# Patient Record
Sex: Male | Born: 1985 | Race: White | Hispanic: No | Marital: Married | Smoking: Never smoker
Health system: Southern US, Community
[De-identification: ages and names within clinical notes are randomized; demographics above are authoritative.]

## PROBLEM LIST (undated history)

## (undated) DIAGNOSIS — Q984 Klinefelter syndrome, unspecified: Secondary | ICD-10-CM

## (undated) DIAGNOSIS — I739 Peripheral vascular disease, unspecified: Secondary | ICD-10-CM

## (undated) DIAGNOSIS — J45909 Unspecified asthma, uncomplicated: Secondary | ICD-10-CM

## (undated) DIAGNOSIS — E291 Testicular hypofunction: Secondary | ICD-10-CM

## (undated) DIAGNOSIS — G4730 Sleep apnea, unspecified: Secondary | ICD-10-CM

## (undated) HISTORY — DX: Klinefelter syndrome, unspecified: Q98.4

## (undated) HISTORY — DX: Peripheral vascular disease, unspecified: I73.9

## (undated) HISTORY — DX: Sleep apnea, unspecified: G47.30

## (undated) HISTORY — DX: Unspecified asthma, uncomplicated: J45.909

## (undated) HISTORY — PX: NO PAST SURGERIES: SHX2092

## (undated) HISTORY — DX: Testicular hypofunction: E29.1

---

## 2012-06-11 DIAGNOSIS — E291 Testicular hypofunction: Secondary | ICD-10-CM | POA: Insufficient documentation

## 2012-06-11 DIAGNOSIS — Q984 Klinefelter syndrome, unspecified: Secondary | ICD-10-CM | POA: Insufficient documentation

## 2014-01-07 DIAGNOSIS — L089 Local infection of the skin and subcutaneous tissue, unspecified: Secondary | ICD-10-CM | POA: Insufficient documentation

## 2014-05-18 DIAGNOSIS — Z79899 Other long term (current) drug therapy: Secondary | ICD-10-CM | POA: Insufficient documentation

## 2014-07-06 ENCOUNTER — Encounter: Payer: Self-pay | Admitting: Surgery

## 2014-07-20 ENCOUNTER — Encounter: Payer: Self-pay | Admitting: Surgery

## 2014-08-03 ENCOUNTER — Encounter: Payer: Self-pay | Admitting: Surgery

## 2016-04-25 ENCOUNTER — Ambulatory Visit (INDEPENDENT_AMBULATORY_CARE_PROVIDER_SITE_OTHER): Payer: Managed Care, Other (non HMO) | Admitting: Family Medicine

## 2016-04-25 ENCOUNTER — Encounter: Payer: Self-pay | Admitting: Family Medicine

## 2016-04-25 VITALS — BP 137/86 | HR 66 | Temp 98.2°F | Ht >= 80 in | Wt 346.0 lb

## 2016-04-25 DIAGNOSIS — H6123 Impacted cerumen, bilateral: Secondary | ICD-10-CM

## 2016-04-25 NOTE — Patient Instructions (Signed)
Follow up as needed

## 2016-04-25 NOTE — Progress Notes (Signed)
   BP 137/86 (BP Location: Left Arm, Patient Position: Sitting, Cuff Size: Large)   Pulse 66   Temp 98.2 F (36.8 C)   Ht 6\' 8"  (2.032 m)   Wt (!) 346 lb (156.9 kg)   SpO2 97%   BMI 38.01 kg/m    Subjective:    Patient ID: Collin Butler, male    DOB: 07/15/1985, 30 y.o.   MRN: [id number]  HPI: Collin Butler is a 30 y.o. male  Chief Complaint  Patient presents with  . Cerumen Impaction   Left ear x 5 days, has been using the peroxide drops which helped initially but now it feels stopped up again over the last 3 days. No pain, but very muffled hearing and feels impacted.  Relevant past medical, surgical, family and social history reviewed and updated as indicated. Interim medical history since our last visit reviewed. Allergies and medications reviewed and updated.  Review of Systems  Constitutional: Negative.   HENT: Positive for hearing loss.   Eyes: Negative.   Respiratory: Negative.   Cardiovascular: Negative.   Gastrointestinal: Negative.   Genitourinary: Negative.   Musculoskeletal: Negative.   Neurological: Negative.   Psychiatric/Behavioral: Negative.     Per HPI unless specifically indicated above     Objective:    BP 137/86 (BP Location: Left Arm, Patient Position: Sitting, Cuff Size: Large)   Pulse 66   Temp 98.2 F (36.8 C)   Ht 6\' 8"  (2.032 m)   Wt (!) 346 lb (156.9 kg)   SpO2 97%   BMI 38.01 kg/m   Wt Readings from Last 3 Encounters:  04/25/16 (!) 346 lb (156.9 kg)    Physical Exam  Constitutional: He is oriented to person, place, and time. He appears well-developed and well-nourished.  HENT:  Head: Atraumatic.  Mouth/Throat: No oropharyngeal exudate.  Significant cerumen impaction in both EACs Gross hearing test reveals decreased hearing in left ear  Eyes: Conjunctivae are normal. No scleral icterus.  Neck: Normal range of motion. Neck supple.  Cardiovascular: Normal rate.   Pulmonary/Chest: Effort normal. No respiratory distress.    Musculoskeletal: Normal range of motion.  Neurological: He is alert and oriented to person, place, and time.  Skin: Skin is warm.  Psychiatric: He has a normal mood and affect. His behavior is normal.  Nursing note and vitals reviewed.  Procedure: B/l ear lavage with warm water mixed with hydrogen peroxide. Patient tolerated procedure well, no immediate complications noted. Wax was removed, b/l TMs intact. Hearing restored.   No results found for this or any previous visit.    Assessment & Plan:   Problem List Items Addressed This Visit    None    Visit Diagnoses    Bilateral impacted cerumen    -  Primary   Good improvement with lavage and curretage. Hearing restored. Minimal residual wax laying against left TM, recommended mineral oil drops several times a day       Follow up plan: Return if symptoms worsen or fail to improve.

## 2016-05-23 DIAGNOSIS — N400 Enlarged prostate without lower urinary tract symptoms: Secondary | ICD-10-CM | POA: Insufficient documentation

## 2016-11-28 ENCOUNTER — Ambulatory Visit (INDEPENDENT_AMBULATORY_CARE_PROVIDER_SITE_OTHER): Payer: Managed Care, Other (non HMO) | Admitting: Family Medicine

## 2016-11-28 VITALS — BP 125/83 | HR 68 | Temp 98.5°F | Ht >= 80 in | Wt 320.0 lb

## 2016-11-28 DIAGNOSIS — L237 Allergic contact dermatitis due to plants, except food: Secondary | ICD-10-CM

## 2016-11-28 MED ORDER — PREDNISONE 10 MG PO TABS: ORAL_TABLET | ORAL | 0 refills | Status: DC

## 2016-11-28 MED ORDER — CLOBETASOL PROPIONATE 0.05 % EX OINT: 1.0000 "application " | TOPICAL_OINTMENT | Freq: Two times a day (BID) | CUTANEOUS | 1 refills | Status: DC

## 2016-11-28 NOTE — Progress Notes (Signed)
   BP 125/83   Pulse 68   Temp 98.5 F (36.9 C)   Ht 6\' 8"  (2.032 m)   Wt (!) 320 lb (145.2 kg)   SpO2 98%   BMI 35.15 kg/m    Subjective:    Patient ID: Collin Butler, male    DOB: 07/28/1985, [age] y.o.   MRN: [id number]  HPI: Collin LaniusJoshua Conely is a 31 y.o. male  Chief Complaint  Patient presents with  . Rash    x 5 days, mostly on arms and legs. Itches and tingles. Did some yard work and took down vines/trees. No change in soaps or detergant. Has used Benadryl topical cream, hydrocortisone cream, oral Benadryl have not helped much.   Patient with 5 days of diffuse rash on all 4 extremities following doing some yard work. Rash is severely pruritic and is not responding to hydrocortisone or benadryl. No new soaps, detergents, lotions, foods. No other members of household have a rash and he was the only one pulling vines and bushes last week.   Relevant past medical, surgical, family and social history reviewed and updated as indicated. Interim medical history since our last visit reviewed. Allergies and medications reviewed and updated.  Review of Systems  Constitutional: Negative.   HENT: Negative.   Respiratory: Negative.   Cardiovascular: Negative.   Gastrointestinal: Negative.   Musculoskeletal: Negative.   Skin: Positive for rash.  Neurological: Negative.   Psychiatric/Behavioral: Negative.     Per HPI unless specifically indicated above     Objective:    BP 125/83   Pulse 68   Temp 98.5 F (36.9 C)   Ht 6\' 8"  (2.032 m)   Wt (!) 320 lb (145.2 kg)   SpO2 98%   BMI 35.15 kg/m   Wt Readings from Last 3 Encounters:  11/28/16 (!) 320 lb (145.2 kg)  04/25/16 (!) 346 lb (156.9 kg)    Physical Exam  Constitutional: He is oriented to person, place, and time. He appears well-developed and well-nourished. No distress.  HENT:  Head: Atraumatic.  Eyes: Conjunctivae are normal. Pupils are equal, round, and reactive to light. No scleral icterus.  Neck: Normal range of  motion. Neck supple.  Cardiovascular: Normal rate and normal heart sounds.   Pulmonary/Chest: Effort normal and breath sounds normal. No respiratory distress.  Musculoskeletal: Normal range of motion.  Neurological: He is alert and oriented to person, place, and time.  Skin: Skin is warm and dry. Rash (Diffuse erythematous rash on all 4 extremities with tiny blistering areas) noted.  Psychiatric: He has a normal mood and affect. His behavior is normal.  Nursing note and vitals reviewed.  No results found for this or any previous visit.    Assessment & Plan:   Problem List Items Addressed This Visit    None    Visit Diagnoses    Poison ivy dermatitis    -  Primary   Given significant breakout, will do long prednisone taper and clobetasol cream. Dicsussed no hot water or scented soaps or lotions, avoid scratching       Follow up plan: Return if symptoms worsen or fail to improve.

## 2017-04-26 ENCOUNTER — Encounter: Payer: Self-pay | Admitting: Family Medicine

## 2017-04-26 ENCOUNTER — Ambulatory Visit (INDEPENDENT_AMBULATORY_CARE_PROVIDER_SITE_OTHER): Payer: Managed Care, Other (non HMO) | Admitting: Family Medicine

## 2017-04-26 VITALS — BP 125/84 | HR 71 | Temp 98.4°F | Wt 333.1 lb

## 2017-04-26 DIAGNOSIS — L723 Sebaceous cyst: Secondary | ICD-10-CM

## 2017-04-26 NOTE — Progress Notes (Signed)
BP 125/84 (BP Location: Left Arm, Patient Position: Sitting, Cuff Size: Large)   Pulse 71   Temp 98.4 F (36.9 C)   Wt (!) 333 lb 2 oz (151.1 kg)   SpO2 96%   BMI 36.60 kg/m    Subjective:    Patient ID: Collin Butler, male    DOB: 04/05/1986, 31 y.o.   MRN: [id number]  HPI: Collin LaniusJoshua Paolella is a 31 y.o. male  Chief Complaint  Patient presents with  . skin lesion    on top of head x 1-2 months   SKIN LESION- thought it was a bug bite then seemed to change into a squishy hurty thing, and then it would start hurting when he would lay back on it, then went away, now not hurting, but still there Duration: 2 months Location: scalp on the back of his head Painful: no Itching: no Onset: sudden Context: not changing Associated signs and symptoms: nothing History of skin cancer: no History of precancerous skin lesions: no Family history of skin cancer: no  Relevant past medical, surgical, family and social history reviewed and updated as indicated. Interim medical history since our last visit reviewed. Allergies and medications reviewed and updated.  Review of Systems  Constitutional: Negative.   Respiratory: Negative.   Cardiovascular: Negative.   Skin: Negative.  Negative for color change, pallor, rash and wound.  Psychiatric/Behavioral: Negative.     Per HPI unless specifically indicated above     Objective:    BP 125/84 (BP Location: Left Arm, Patient Position: Sitting, Cuff Size: Large)   Pulse 71   Temp 98.4 F (36.9 C)   Wt (!) 333 lb 2 oz (151.1 kg)   SpO2 96%   BMI 36.60 kg/m   Wt Readings from Last 3 Encounters:  04/26/17 (!) 333 lb 2 oz (151.1 kg)  11/28/16 (!) 320 lb (145.2 kg)  04/25/16 (!) 346 lb (156.9 kg)    Physical Exam  Constitutional: He is oriented to person, place, and time. He appears well-developed and well-nourished. No distress.  HENT:  Head: Normocephalic and atraumatic.  Right Ear: Hearing normal.  Left Ear: Hearing normal.  Nose:  Nose normal.  Eyes: Conjunctivae and lids are normal. Right eye exhibits no discharge. Left eye exhibits no discharge. No scleral icterus.  Cardiovascular: Normal rate, regular rhythm, normal heart sounds and intact distal pulses.  Exam reveals no gallop and no friction rub.   No murmur heard. Pulmonary/Chest: Effort normal and breath sounds normal. No respiratory distress. He has no wheezes. He has no rales. He exhibits no tenderness.  Musculoskeletal: Normal range of motion.  Neurological: He is alert and oriented to person, place, and time.  Skin: Skin is warm, dry and intact. No rash noted. He is not diaphoretic. No erythema. No pallor.  1/2 in soft, not infected sebaceous cyst on the R posterior of the crown of his head  Psychiatric: He has a normal mood and affect. His speech is normal and behavior is normal. Judgment and thought content normal. Cognition and memory are normal.  Nursing note and vitals reviewed.   No results found for this or any previous visit.    Assessment & Plan:   Problem List Items Addressed This Visit    None    Visit Diagnoses    Sebaceous cyst    -  Primary   Reassured patient. Will remove if he'd like. Information provided. Call with any concerns.        Follow up plan:  Return if symptoms worsen or fail to improve.

## 2017-04-26 NOTE — Patient Instructions (Addendum)
Epidermal Cyst An epidermal cyst is sometimes called an epidermal inclusion cyst or an infundibular cyst. It is a sac made of skin tissue. The sac contains a substance called keratin. Keratin is a protein that is normally secreted through the hair follicles. When keratin becomes trapped in the top layer of skin (epidermis), it can form an epidermal cyst. Epidermal cysts are usually found on the face, neck, trunk, and genitals. These cysts are usually harmless (benign), and they may not cause symptoms unless they become infected. It is important not to pop epidermal cysts yourself. What are the causes? This condition may be caused by:  A blocked hair follicle.  A hair that curls and re-enters the skin instead of growing straight out of the skin (ingrown hair).  A blocked pore.  Irritated skin.  An injury to the skin.  Certain conditions that are passed along from parent to child (inherited).  Human papillomavirus (HPV).  What increases the risk? The following factors may make you more likely to develop an epidermal cyst:  Having acne.  Being overweight.  Wearing tight clothing.  What are the signs or symptoms? The only symptom of this condition may be a small, painless lump underneath the skin. When an epidermal cyst becomes infected, symptoms may include:  Redness.  Inflammation.  Tenderness.  Warmth.  Fever.  Keratin draining from the cyst. Keratin may look like a grayish-white, bad-smelling substance.  Pus draining from the cyst.  How is this diagnosed? This condition is diagnosed with a physical exam. In some cases, you may have a sample of tissue (biopsy) taken from your cyst to be examined under a microscope or tested for bacteria. You may be referred to a health care provider who specializes in skin care (dermatologist). How is this treated? In many cases, epidermal cysts go away on their own without treatment. If a cyst becomes infected, treatment may  include:  Opening and draining the cyst. After draining, minor surgery to remove the rest of the cyst may be done.  Antibiotic medicine to help prevent infection.  Injections of medicines (steroids) that help to reduce inflammation.  Surgery to remove the cyst. Surgery may be done if: ? The cyst becomes large. ? The cyst bothers you. ? There is a chance that the cyst could turn into cancer.  Follow these instructions at home:  Take over-the-counter and prescription medicines only as told by your health care provider.  If you were prescribed an antibiotic, use it as told by your health care provider. Do not stop using the antibiotic even if you start to feel better.  Keep the area around your cyst clean and dry.  Wear loose, dry clothing.  Do not try to pop your cyst.  Avoid touching your cyst.  Check your cyst every day for signs of infection.  Keep all follow-up visits as told by your health care provider. This is important. How is this prevented?  Wear clean, dry, clothing.  Avoid wearing tight clothing.  Keep your skin clean and dry. Shower or take baths every day.  Wash your body with a benzoyl peroxide wash when you shower or bathe. Contact a health care provider if:  Your cyst develops symptoms of infection.  Your condition is not improving or is getting worse.  You develop a cyst that looks different from other cysts you have had.  You have a fever. Get help right away if:  Redness spreads from the cyst into the surrounding area. This information is   not intended to replace advice given to you by your health care provider. Make sure you discuss any questions you have with your health care provider. Document Released: 05/20/2004 Document Revised: 02/16/2016 Document Reviewed: 04/21/2015 Elsevier Interactive Patient Education  2018 Elsevier Inc.   Epidermal Cyst Removal Epidermal cyst removal is a procedure to remove a sac of oily material that forms  under your skin (epidermal cyst). Epidermal cysts may also be called epidermoid cysts or keratin cysts. Normally, the skin secretes this oily material through a gland or a hair follicle. This type of cyst usually results when a skin gland or hair follicle becomes blocked. You may need this procedure if you have an epidermal cyst that becomes large, uncomfortable, or infected. Tell a health care provider about:  Any allergies you have.  All medicines you are taking, including vitamins, herbs, eye drops, creams, and over-the-counter medicines.  Any problems you or family members have had with anesthetic medicines.  Any blood disorders you have.  Any surgeries you have had.  Any medical conditions you have. What are the risks? Generally, this is a safe procedure. However, problems may occur, including:  Developing another cyst.  Bleeding.  Infection.  Scarring.  What happens before the procedure?  Ask your health care provider about: ? Changing or stopping your regular medicines. This is especially important if you are taking diabetes medicines or blood thinners. ? Taking medicines such as aspirin and ibuprofen. These medicines can thin your blood. Do not take these medicines before your procedure if your health care provider instructs you not to.  If you have an infected cyst, you may have to take antibiotic medicines before or after the cyst removal. Take your antibiotics as directed by your health care provider. Finish all of the medicine even if you start to feel better.  Take a shower on the morning of your procedure. Your health care provider may ask you to use a germ-killing (antiseptic) soap. What happens during the procedure?  You will be given a medicine that numbs the area (local anesthetic).  The skin around the cyst will be cleaned with a germ-killing solution (antiseptic).  Your health care provider will make a small surgical incision over the cyst.  The cyst  will be separated from the surrounding tissues that are under your skin.  If possible, the cyst will be removed undamaged (intact).  If the cyst bursts (ruptures), it will need to be removed in pieces.  After the cyst is removed, your health care provider will control any bleeding and close the incision with small stitches (sutures). Small incisions may not need sutures, and the bleeding will be controlled by applying direct pressure with gauze.  Your health care provider may apply antibiotic ointment and a light bandage (dressing) over the incision. This procedure may vary among health care providers and hospitals. What happens after the procedure?  If your cyst ruptured during surgery, you may need to take antibiotic medicine. If you were prescribed an antibiotic medicine, finish all of it even if you start to feel better. This information is not intended to replace advice given to you by your health care provider. Make sure you discuss any questions you have with your health care provider. Document Released: 06/16/2000 Document Revised: 11/25/2015 Document Reviewed: 03/04/2014 Elsevier Interactive Patient Education  2018 Elsevier Inc.  

## 2017-06-12 ENCOUNTER — Ambulatory Visit: Payer: Managed Care, Other (non HMO) | Admitting: Urology

## 2017-06-12 ENCOUNTER — Encounter: Payer: Self-pay | Admitting: Urology

## 2017-06-12 VITALS — BP 146/85 | HR 90 | Ht >= 80 in | Wt 333.1 lb

## 2017-06-12 DIAGNOSIS — E291 Testicular hypofunction: Secondary | ICD-10-CM | POA: Diagnosis not present

## 2017-06-12 DIAGNOSIS — Q984 Klinefelter syndrome, unspecified: Secondary | ICD-10-CM | POA: Diagnosis not present

## 2017-06-12 NOTE — Progress Notes (Signed)
06/12/2017 10:19 PM   Casimer LaniusJoshua Behrle 12/15/1985 [id number]  Referring provider: Particia NearingLane, Rachel Elizabeth, PA-C [address] [place], [place] [address]  Chief Complaint  Patient presents with  . Hypogonadism    HPI: 31 y.o. 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  [facility] and he has done well on Testopel at 18 pellets every 4-5 months.  His last implant was on 12/05/2016.  He complains of increased tiredness, fatigue and decreased libido.  He would like to get Testopel scheduled.His last testosterone level in early October was 203.   PMH: Past Medical History:  Diagnosis Date  . Asthma   . Hypogonadism in male   . Klinefelter syndrome   . Sleep apnea     Surgical History: History reviewed. No pertinent surgical history.  Home Medications:  Allergies as of 06/12/2017   No Known Allergies     Medication List        Accurate as of 06/12/17 10:19 PM. Always use your most recent med list.          ibuprofen 200 MG tablet Commonly known as:  ADVIL,MOTRIN Take 200 mg by mouth.   Testosterone 75 MG Pllt Inject 1,200 mg into the skin.       Allergies: No Known Allergies  Family History: Family History  Problem Relation Age of Onset  . Diabetes type I Sister   . Cancer Maternal Grandmother   . Hypertension Maternal Grandfather   . Prostate cancer Neg Hx   . Bladder Cancer Neg Hx   . Kidney cancer Neg Hx     Social History:  reports that  has never smoked. he has never used smokeless tobacco. He reports that he drinks alcohol. He reports that he does not use drugs.  ROS: UROLOGY Frequent Urination?: No Hard to postpone urination?: No Burning/pain with urination?: No Get up at night to urinate?: Yes Leakage of urine?: No Urine stream starts and stops?: No Trouble starting stream?: No Do you have to strain to urinate?: No Blood in urine?: No Urinary tract infection?: No Sexually transmitted disease?:  No Injury to kidneys or bladder?: No Painful intercourse?: No Weak stream?: No Erection problems?: No Penile pain?: No  Gastrointestinal Nausea?: No Vomiting?: No Indigestion/heartburn?: No Diarrhea?: No Constipation?: No  Constitutional Fever: No Night sweats?: No Weight loss?: No Fatigue?: Yes  Skin Skin rash/lesions?: No Itching?: No  Eyes Blurred vision?: No Double vision?: No  Ears/Nose/Throat Sore throat?: No Sinus problems?: No  Hematologic/Lymphatic Swollen glands?: No Easy bruising?: No  Cardiovascular Leg swelling?: No Chest pain?: No  Respiratory Cough?: No Shortness of breath?: No  Endocrine Excessive thirst?: No  Musculoskeletal Back pain?: No Joint pain?: No  Neurological Headaches?: No Dizziness?: No  Psychologic Depression?: No Anxiety?: No  Physical Exam: BP (!) 146/85 (BP Location: Right Arm, Patient Position: Sitting, Cuff Size: Large)   Pulse 90   Ht 6\' 8"  (2.032 m)   Wt (!) 333 lb 1.6 oz (151.1 kg)   BMI 36.59 kg/m    Constitutional:  Alert and oriented, No acute distress. HEENT: [place] AT, moist mucus membranes.  Trachea midline, no masses. Cardiovascular: No clubbing, cyanosis, or edema. Respiratory: Normal respiratory effort, no increased work of breathing. GI: Abdomen is soft, nontender, nondistended, no abdominal masses GU: No CVA tenderness.  Skin: No rashes, bruises or suspicious lesions. Lymph: No cervical or inguinal adenopathy. Neurologic: Grossly intact, no focal deficits, moving all 4 extremities. Psychiatric:  Normal mood and affect.   Assessment & Plan:   Will need to get his implant authorized by insurance and will then schedule.  Repeat testosterone level drawn today.  1. Klinefelter syndrome   2. Primary testicular hypogonadism  - Testosterone   Riki AltesScott C Opal Mckellips, MD  [facility] [address], [address] [place], [place] [address] [phone]

## 2017-06-13 ENCOUNTER — Telehealth: Payer: Self-pay

## 2017-06-13 LAB — TESTOSTERONE: Testosterone: 48 ng/dL — ABNORMAL LOW (ref 264–916)

## 2017-06-13 NOTE — Telephone Encounter (Signed)
-----   Message from Riki AltesScott C Stoioff, MD sent at 06/13/2017  7:21 AM EST ----- Testosterone level was 48.  Awaiting approval and scheduling of Testopel-18 pellets.

## 2017-06-13 NOTE — Telephone Encounter (Signed)
Did you handle this or do I need to call?

## 2017-06-18 NOTE — Telephone Encounter (Signed)
Still waiting to hear back from insurance company.

## 2017-06-18 NOTE — Telephone Encounter (Signed)
Pt has been made aware 10 testopel pellets were approved but not the 18. Pt is aware [facility] is working on getting the other 8 approved. Pt inquired about an injection. Please advise.

## 2017-06-19 ENCOUNTER — Ambulatory Visit (INDEPENDENT_AMBULATORY_CARE_PROVIDER_SITE_OTHER): Payer: Managed Care, Other (non HMO)

## 2017-06-19 DIAGNOSIS — E291 Testicular hypofunction: Secondary | ICD-10-CM

## 2017-06-19 MED ORDER — TESTOSTERONE CYPIONATE 200 MG/ML IM SOLN: 300.0000 mg | Freq: Once | INTRAMUSCULAR | 0 refills | Status: DC

## 2017-06-19 MED ORDER — TESTOSTERONE CYPIONATE 200 MG/ML IM SOLN
200.0000 mg | Freq: Once | INTRAMUSCULAR | Status: AC
  Administered 2017-06-19: 200 mg via INTRAMUSCULAR

## 2017-06-19 NOTE — Telephone Encounter (Signed)
Spoke with pt in reference to testosterone injections. Made aware can come in for a 1 time injection. Pt voiced understanding.

## 2017-06-19 NOTE — Telephone Encounter (Signed)
Rx testosterone sent to pharm- he can come in for 1 time injection

## 2017-06-19 NOTE — Progress Notes (Signed)
Testosterone IM Injection  Due to Hypogonadism patient is present today for a Testosterone Injection.  Medication: Testosterone Cypionate Dose: 1.985mL Location: right upper outer buttocks Lot: [id number] Exp:10/2018  Patient tolerated well, no complications were noted  Preformed by: Rupert Stackshelsea Mikiah Demond, LPN

## 2017-07-06 ENCOUNTER — Ambulatory Visit
Admission: EM | Admit: 2017-07-06 | Discharge: 2017-07-06 | Disposition: A | Discharge status: Home / Self Care | Payer: Managed Care, Other (non HMO) | Attending: Family Medicine | Admitting: Family Medicine

## 2017-07-06 ENCOUNTER — Other Ambulatory Visit: Payer: Self-pay

## 2017-07-06 DIAGNOSIS — M791 Myalgia, unspecified site: Secondary | ICD-10-CM

## 2017-07-06 DIAGNOSIS — J029 Acute pharyngitis, unspecified: Secondary | ICD-10-CM

## 2017-07-06 DIAGNOSIS — R509 Fever, unspecified: Secondary | ICD-10-CM

## 2017-07-06 DIAGNOSIS — J111 Influenza due to unidentified influenza virus with other respiratory manifestations: Secondary | ICD-10-CM

## 2017-07-06 DIAGNOSIS — R05 Cough: Secondary | ICD-10-CM

## 2017-07-06 DIAGNOSIS — R69 Illness, unspecified: Secondary | ICD-10-CM

## 2017-07-06 MED ORDER — OSELTAMIVIR PHOSPHATE 75 MG PO CAPS: 75.0000 mg | ORAL_CAPSULE | Freq: Two times a day (BID) | ORAL | 0 refills | Status: DC

## 2017-07-06 NOTE — ED Provider Notes (Signed)
[facility]    CSN: [id number] Arrival date & time: 07/06/17  1053     History   Chief Complaint Chief Complaint  Patient presents with  . Influenza    HPI Collin Butler is a 32 y.o. male.   The history is provided by the patient.  Influenza  Presenting symptoms: cough, fatigue, fever, myalgias, rhinorrhea and sore throat   Associated symptoms: nasal congestion   URI  Presenting symptoms: congestion, cough, fatigue, fever, rhinorrhea and sore throat   Severity:  Moderate Onset quality:  Sudden Duration:  2 days Timing:  Constant Progression:  Worsening Chronicity:  New Relieved by:  Nothing Ineffective treatments:  OTC medications Associated symptoms: arthralgias and myalgias   Risk factors: chronic respiratory disease (asthma (mild, intermittent)) and sick contacts   Risk factors: not elderly, no chronic cardiac disease, no chronic kidney disease, no diabetes mellitus, no immunosuppression, no recent illness and no recent travel     Past Medical History:  Diagnosis Date  . Asthma   . Hypogonadism in male   . Klinefelter syndrome   . Sleep apnea     There are no active problems to display for this patient.   History reviewed. No pertinent surgical history.     Home Medications    Prior to Admission medications   Medication Sig Start Date End Date Taking? Authorizing Provider  ibuprofen (ADVIL,MOTRIN) 200 MG tablet Take 200 mg by mouth.    [provider]  oseltamivir (TAMIFLU) 75 MG capsule Take 1 capsule (75 mg total) by mouth 2 (two) times daily. 07/06/17   Payton Mccallum, MD  Testosterone 75 MG PLLT Inject 1,200 mg into the skin. 06/09/14   [provider]  testosterone cypionate (DEPOTESTOSTERONE CYPIONATE) 200 MG/ML injection Inject 1.5 mLs (300 mg total) into the muscle once for 1 dose. 06/19/17 06/19/17  Riki Altes, MD    Family History Family History  Problem Relation Age of Onset  . Diabetes type I Sister     . Cancer Maternal Grandmother   . Hypertension Maternal Grandfather   . Prostate cancer Neg Hx   . Bladder Cancer Neg Hx   . Kidney cancer Neg Hx     Social History Social History   Tobacco Use  . Smoking status: Never Smoker  . Smokeless tobacco: Never Used  Substance Use Topics  . Alcohol use: Yes    Comment: rare  . Drug use: No     Allergies   Patient has no known allergies.   Review of Systems Review of Systems  Constitutional: Positive for fatigue and fever.  HENT: Positive for congestion, rhinorrhea and sore throat.   Respiratory: Positive for cough.   Musculoskeletal: Positive for arthralgias and myalgias.     Physical Exam Triage Vital Signs ED Triage Vitals  Enc Vitals Group     BP 07/06/17 1109 (!) 127/96     Pulse Rate 07/06/17 1109 (!) 123     Resp 07/06/17 1109 20     Temp 07/06/17 1109 99.8 F (37.7 C)     Temp Source 07/06/17 1109 Oral     SpO2 07/06/17 1109 99 %     Weight 07/06/17 1110 (!) 325 lb (147.4 kg)     Height 07/06/17 1110 6\' 8"  (2.032 m)     Head Circumference --      Peak Flow --      Pain Score 07/06/17 1110 4     Pain Loc --  Pain Edu? --      Excl. in GC? --    No data found.  Updated Vital Signs BP (!) 127/96   Pulse (!) 123   Temp 99.8 F (37.7 C) (Oral)   Resp 20   Ht 6\' 8"  (2.032 m)   Wt (!) 325 lb (147.4 kg)   SpO2 99%   BMI 35.70 kg/m   Visual Acuity Right Eye Distance:   Left Eye Distance:   Bilateral Distance:    Right Eye Near:   Left Eye Near:    Bilateral Near:     Physical Exam  Constitutional: He appears well-developed and well-nourished. No distress.  HENT:  Head: Normocephalic and atraumatic.  Right Ear: Tympanic membrane, external ear and ear canal normal.  Left Ear: Tympanic membrane, external ear and ear canal normal.  Nose: Nose normal.  Mouth/Throat: Uvula is midline and mucous membranes are normal. Posterior oropharyngeal erythema present. No oropharyngeal exudate, posterior  oropharyngeal edema or tonsillar abscesses. No tonsillar exudate.  Eyes: Conjunctivae and EOM are normal. Pupils are equal, round, and reactive to light. Right eye exhibits no discharge. Left eye exhibits no discharge. No scleral icterus.  Neck: Normal range of motion. Neck supple. No tracheal deviation present. No thyromegaly present.  Cardiovascular: Normal rate, regular rhythm and normal heart sounds.  Pulmonary/Chest: Effort normal and breath sounds normal. No stridor. No respiratory distress. He has no wheezes. He has no rales. He exhibits no tenderness.  Lymphadenopathy:    He has no cervical adenopathy.  Neurological: He is alert.  Skin: Skin is warm and dry. No rash noted. He is not diaphoretic.  Nursing note and vitals reviewed.    UC Treatments / Results  Labs (all labs ordered are listed, but only abnormal results are displayed) Labs Reviewed - No data to display  EKG  EKG Interpretation None       Radiology No results found.  Procedures Procedures (including critical care time)  Medications Ordered in UC Medications - No data to display   Initial Impression / Assessment and Plan / UC Course  I have reviewed the triage vital signs and the nursing notes.  Pertinent labs & imaging results that were available during my care of the patient were reviewed by me and considered in my medical decision making (see chart for details).       Final Clinical Impressions(s) / UC Diagnoses   Final diagnoses:  Influenza-like illness    ED Discharge Orders        Ordered    oseltamivir (TAMIFLU) 75 MG capsule  2 times daily     07/06/17 1204     1. diagnosis reviewed with patient 2. rx as per orders above; reviewed possible side effects, interactions, risks and benefits  3. Recommend supportive treatment with rest, fluids, otc analgesics prn 4. Follow-up prn if symptoms worsen or don't improve   Controlled Substance Prescriptions [place] Controlled Substance  Registry consulted? Not Applicable   Payton Mccallumonty, Mayco Walrond, MD 07/06/17 1245

## 2017-07-06 NOTE — ED Triage Notes (Signed)
Pt with generalized body aches, sore throat, headache, chills & sweats, cough, runny nose x past few days. Pain 4/10

## 2017-07-12 ENCOUNTER — Ambulatory Visit: Payer: Managed Care, Other (non HMO) | Admitting: Family Medicine

## 2017-07-12 ENCOUNTER — Ambulatory Visit (INDEPENDENT_AMBULATORY_CARE_PROVIDER_SITE_OTHER): Payer: Managed Care, Other (non HMO) | Admitting: Family Medicine

## 2017-07-12 ENCOUNTER — Encounter: Payer: Self-pay | Admitting: Family Medicine

## 2017-07-12 VITALS — BP 131/84 | HR 90 | Temp 98.5°F | Wt 337.0 lb

## 2017-07-12 DIAGNOSIS — R509 Fever, unspecified: Secondary | ICD-10-CM | POA: Diagnosis not present

## 2017-07-12 DIAGNOSIS — J4521 Mild intermittent asthma with (acute) exacerbation: Secondary | ICD-10-CM | POA: Diagnosis not present

## 2017-07-12 DIAGNOSIS — J45909 Unspecified asthma, uncomplicated: Secondary | ICD-10-CM | POA: Insufficient documentation

## 2017-07-12 MED ORDER — AZITHROMYCIN 250 MG PO TABS: ORAL_TABLET | ORAL | 0 refills | Status: DC

## 2017-07-12 MED ORDER — BENZONATATE 100 MG PO CAPS: 100.0000 mg | ORAL_CAPSULE | Freq: Two times a day (BID) | ORAL | 0 refills | Status: DC | PRN

## 2017-07-12 MED ORDER — PREDNISONE 10 MG PO TABS: ORAL_TABLET | ORAL | 0 refills | Status: DC

## 2017-07-12 NOTE — Progress Notes (Signed)
   BP 131/84   Pulse 90   Temp 98.5 F (36.9 C) (Oral)   Wt (!) 337 lb (152.9 kg)   SpO2 99%   BMI 37.02 kg/m    Subjective:    Patient ID: Collin Butler, male    DOB: 08/14/1985, 32 y.o.   MRN: [id number]  HPI: Collin Butler is a 32 y.o. male  atient with cough cold congestion ongoing for weeks  Has been using albuterol inhaler to help with cough as has asthma and feels like he is having an exacerbation of asthma. Also sinus pressure congestion  Relevant past medical, surgical, family and social history reviewed and updated as indicated. Interim medical history since our last visit reviewed. Allergies and medications reviewed and updated.  Review of Systems  Constitutional: Positive for chills, diaphoresis and fatigue. Negative for fever.  HENT: Positive for congestion.   Respiratory: Positive for cough, chest tightness, shortness of breath and wheezing.   Cardiovascular: Negative.     Per HPI unless specifically indicated above     Objective:    BP 131/84   Pulse 90   Temp 98.5 F (36.9 C) (Oral)   Wt (!) 337 lb (152.9 kg)   SpO2 99%   BMI 37.02 kg/m   Wt Readings from Last 3 Encounters:  07/12/17 (!) 337 lb (152.9 kg)  07/06/17 (!) 325 lb (147.4 kg)  06/12/17 (!) 333 lb 1.6 oz (151.1 kg)    Physical Exam  Constitutional: He is oriented to person, place, and time. He appears well-developed and well-nourished.  HENT:  Head: Normocephalic and atraumatic.  Right Ear: External ear normal.  Left Ear: External ear normal.  Mouth/Throat: Oropharynx is clear and moist.  Eyes: Conjunctivae and EOM are normal.  Neck: Normal range of motion.  Cardiovascular: Normal rate, regular rhythm and normal heart sounds.  Pulmonary/Chest: Effort normal. He has wheezes. He has no rales. He exhibits no tenderness.  Musculoskeletal: Normal range of motion.  Neurological: He is alert and oriented to person, place, and time.  Skin: No erythema.  Psychiatric: He has a normal mood  and affect. His behavior is normal. Judgment and thought content normal.    Results for orders placed or performed in visit on 06/12/17  Testosterone  Result Value Ref Range   Testosterone 48 (L) 264 - 916 ng/dL      Assessment & Plan:   Problem List Items Addressed This Visit      Respiratory   Asthma    Discuss asthma bronchitisand is been ongoing long enough will start with a Z-Pak?and Tessalon Perles Patient education given on use of medication      Relevant Medications   predniSONE (DELTASONE) 10 MG tablet    Other Visit Diagnoses    Fever, unspecified fever cause    -  Primary   Relevant Orders   Influenza a and b       Follow up plan: Return for As scheduled.

## 2017-07-12 NOTE — Assessment & Plan Note (Signed)
Discuss asthma bronchitisand is been ongoing long enough will start with a Z-Pak?and Tessalon Perles Patient education given on use of medication

## 2017-09-04 ENCOUNTER — Ambulatory Visit (INDEPENDENT_AMBULATORY_CARE_PROVIDER_SITE_OTHER): Payer: Managed Care, Other (non HMO) | Admitting: Urology

## 2017-09-04 ENCOUNTER — Telehealth: Payer: Self-pay | Admitting: Urology

## 2017-09-04 ENCOUNTER — Encounter: Payer: Self-pay | Admitting: Urology

## 2017-09-04 VITALS — BP 140/82 | HR 89 | Ht >= 80 in | Wt 337.6 lb

## 2017-09-04 DIAGNOSIS — E291 Testicular hypofunction: Secondary | ICD-10-CM | POA: Diagnosis not present

## 2017-09-04 NOTE — Progress Notes (Signed)
Procedure note: Testopel  Diagnosis: Hypogonadism  Physician: Riki AltesScott C Shallyn Constancio, MD  Assistant: Teressa Lowerarrie Garrison  Anesthetic: 1% plain Xylocaine with epinephrine  Description: A site on the upper outer right buttock was selected and prepped with Betadine and draped in the usual fashion.  The site was anesthetized with 5 mL of the above anesthetic in a fan shape configuration.  A stab incision was made with 11 blade and spread with forceps.  Using the implantation trocar 18 pellets were placed in 4 rows. The site was closed with 5-0 Monocryl and dressed with Steri-Strips, gauze and Tegaderm.  Body weight pressure was placed for 5 minutes.  Complications: None  EBL: Minimal  Discharge instructions: 1.  May shower in 24 hours.  No swimming, tub bath X 1 week. 2.  Call for redness, drainage, increasing pain or fever greater than 101 degrees. 3.  Follow-up testosterone level and hematocrit in 2 months.

## 2017-09-04 NOTE — Telephone Encounter (Signed)
Testopel insurance authorization  [employer] [id number] Authorization # [id number] 1350 mg (18 pellets) every 3 months through 11/01/2017

## 2017-11-05 ENCOUNTER — Other Ambulatory Visit: Payer: Managed Care, Other (non HMO)

## 2017-11-05 ENCOUNTER — Other Ambulatory Visit: Payer: Self-pay

## 2017-11-05 DIAGNOSIS — E291 Testicular hypofunction: Secondary | ICD-10-CM

## 2017-11-06 LAB — TESTOSTERONE: TESTOSTERONE: 723 ng/dL (ref 264–916)

## 2017-11-06 LAB — HEMATOCRIT: Hematocrit: 49.5 % (ref 37.5–51.0)

## 2017-11-09 ENCOUNTER — Telehealth: Payer: Self-pay

## 2017-11-09 NOTE — Telephone Encounter (Signed)
-----   Message from Riki Altes, MD sent at 11/08/2017  7:09 PM EDT ----- Testosterone level looks good at 723.  Hematocrit normal at 48.  Recommend repeat testosterone level in 2 months and repeat Testopel implant approximately 2 weeks after his testosterone blood draw.

## 2017-11-09 NOTE — Telephone Encounter (Signed)
Testosterone lab scheduled, pt informed, please call and schedule testopel for aprox 2 wks after 01/08/18. Thank you

## 2017-11-13 ENCOUNTER — Telehealth: Payer: Self-pay | Admitting: Urology

## 2017-11-13 NOTE — Telephone Encounter (Signed)
App made ° ° °Michelle  °

## 2018-01-08 ENCOUNTER — Other Ambulatory Visit: Payer: Managed Care, Other (non HMO)

## 2018-01-08 DIAGNOSIS — E291 Testicular hypofunction: Secondary | ICD-10-CM

## 2018-01-09 ENCOUNTER — Telehealth: Payer: Self-pay | Admitting: Family Medicine

## 2018-01-09 LAB — TESTOSTERONE: TESTOSTERONE: 334 ng/dL (ref 264–916)

## 2018-01-09 NOTE — Telephone Encounter (Signed)
-----   Message from Riki AltesScott C Stoioff, MD sent at 01/09/2018  7:46 AM EDT ----- Testosterone level was 334.  Keep scheduled Testopel appointment later this month.

## 2018-01-09 NOTE — Telephone Encounter (Signed)
LMOM for patient to return call.

## 2018-01-11 NOTE — Telephone Encounter (Signed)
Called pt, LM for pt informing him of the information below. Advised to call back for questions or concerns.

## 2018-01-22 ENCOUNTER — Encounter: Payer: Self-pay | Admitting: Urology

## 2018-01-22 ENCOUNTER — Ambulatory Visit (INDEPENDENT_AMBULATORY_CARE_PROVIDER_SITE_OTHER): Payer: Managed Care, Other (non HMO) | Admitting: Urology

## 2018-01-22 VITALS — BP 133/84 | HR 75 | Ht >= 80 in | Wt 346.1 lb

## 2018-01-22 DIAGNOSIS — E291 Testicular hypofunction: Secondary | ICD-10-CM | POA: Diagnosis not present

## 2018-01-22 NOTE — Progress Notes (Signed)
Procedure note: Testopel  Diagnosis: Hypogonadism  Physician: Riki AltesScott C Zuria Fosdick, MD  Assistant: Debbe Baleskinawa Glanton, CMA  Anesthetic: 1% plain Xylocaine with epinephrine  Description: A site on the upper outer left buttock was selected and prepped with Betadine and draped in the usual fashion.  The site was anesthetized with 10 mL of the above anesthetic in a fan shape and configuration.  A stab incision was made with 11 blade and spread with forceps.  Using the implantation trocar 5 pellets were placed in 2 rows and 4 pellets in 2 rows for a total of 18 pellets.  The site was dressed with Steri-Strips, gauze and a Tegaderm.  The site was closed with 5-0 Vicryl and dressed with Steri-Strips, gauze and Tegaderm.  Body weight pressure was placed for 5 minutes.  Complications: None  EBL: Minimal  Discharge instructions: 1.  May shower in 24 hours.  No swimming, tub bath X 1 week. 2.  Call for redness, drainage, increasing pain or fever greater than 101 degrees. 3.  Follow-up lab visit 3 months for testosterone, PSA, hematocrit and 4 months for tentative repeat Testopel.

## 2018-01-23 ENCOUNTER — Encounter: Payer: Self-pay | Admitting: Urology

## 2018-03-27 ENCOUNTER — Ambulatory Visit (INDEPENDENT_AMBULATORY_CARE_PROVIDER_SITE_OTHER): Payer: Managed Care, Other (non HMO) | Admitting: Family Medicine

## 2018-03-27 ENCOUNTER — Other Ambulatory Visit: Payer: Self-pay

## 2018-03-27 ENCOUNTER — Encounter: Payer: Self-pay | Admitting: Family Medicine

## 2018-03-27 VITALS — BP 138/91 | HR 71 | Temp 98.0°F | Ht >= 80 in | Wt 353.0 lb

## 2018-03-27 DIAGNOSIS — Z7689 Persons encountering health services in other specified circumstances: Secondary | ICD-10-CM

## 2018-03-27 DIAGNOSIS — E291 Testicular hypofunction: Secondary | ICD-10-CM | POA: Diagnosis not present

## 2018-03-27 DIAGNOSIS — Z0000 Encounter for general adult medical examination without abnormal findings: Secondary | ICD-10-CM | POA: Diagnosis not present

## 2018-03-27 DIAGNOSIS — Z23 Encounter for immunization: Secondary | ICD-10-CM | POA: Diagnosis not present

## 2018-03-27 DIAGNOSIS — I872 Venous insufficiency (chronic) (peripheral): Secondary | ICD-10-CM

## 2018-03-27 DIAGNOSIS — J309 Allergic rhinitis, unspecified: Secondary | ICD-10-CM | POA: Insufficient documentation

## 2018-03-27 DIAGNOSIS — R221 Localized swelling, mass and lump, neck: Secondary | ICD-10-CM | POA: Diagnosis not present

## 2018-03-27 LAB — MICROSCOPIC EXAMINATION
BACTERIA UA: NONE SEEN
RBC, UA: NONE SEEN /hpf (ref 0–2)

## 2018-03-27 LAB — UA/M W/RFLX CULTURE, ROUTINE
Bilirubin, UA: NEGATIVE
Glucose, UA: NEGATIVE
Ketones, UA: NEGATIVE
LEUKOCYTES UA: NEGATIVE
Nitrite, UA: NEGATIVE
PH UA: 5.5 (ref 5.0–7.5)
RBC, UA: NEGATIVE
Specific Gravity, UA: 1.015 (ref 1.005–1.030)
Urobilinogen, Ur: 0.2 mg/dL (ref 0.2–1.0)

## 2018-03-27 NOTE — Patient Instructions (Signed)
Tdap Vaccine (Tetanus, Diphtheria and Pertussis): What You Need to Know 1. Why get vaccinated? Tetanus, diphtheria and pertussis are very serious diseases. Tdap vaccine can protect us from these diseases. And, Tdap vaccine given to pregnant women can protect newborn babies against pertussis. TETANUS (Lockjaw) is rare in the [place] today. It causes painful muscle tightening and stiffness, usually all over the body.  It can lead to tightening of muscles in the head and neck so you can't open your mouth, swallow, or sometimes even breathe. Tetanus kills about 1 out of 10 people who are infected even after receiving the best medical care.  DIPHTHERIA is also rare in the [place] today. It can cause a thick coating to form in the back of the throat.  It can lead to breathing problems, heart failure, paralysis, and death.  PERTUSSIS (Whooping Cough) causes severe coughing spells, which can cause difficulty breathing, vomiting and disturbed sleep.  It can also lead to weight loss, incontinence, and rib fractures. Up to 2 in 100 adolescents and 5 in 100 adults with pertussis are hospitalized or have complications, which could include pneumonia or death.  These diseases are caused by bacteria. Diphtheria and pertussis are spread from person to person through secretions from coughing or sneezing. Tetanus enters the body through cuts, scratches, or wounds. Before vaccines, as many as 200,000 cases of diphtheria, 200,000 cases of pertussis, and hundreds of cases of tetanus, were reported in the [place] each year. Since vaccination began, reports of cases for tetanus and diphtheria have dropped by about 99% and for pertussis by about 80%. 2. Tdap vaccine Tdap vaccine can protect adolescents and adults from tetanus, diphtheria, and pertussis. One dose of Tdap is routinely given at age 11 or 12. People who did not get Tdap at that age should get it as soon as possible. Tdap is especially  important for healthcare professionals and anyone having close contact with a baby younger than 12 months. Pregnant women should get a dose of Tdap during every pregnancy, to protect the newborn from pertussis. Infants are most at risk for severe, life-threatening complications from pertussis. Another vaccine, called Td, protects against tetanus and diphtheria, but not pertussis. A Td booster should be given every 10 years. Tdap may be given as one of these boosters if you have never gotten Tdap before. Tdap may also be given after a severe cut or burn to prevent tetanus infection. Your doctor or the person giving you the vaccine can give you more information. Tdap may safely be given at the same time as other vaccines. 3. Some people should not get this vaccine  A person who has ever had a life-threatening allergic reaction after a previous dose of any diphtheria, tetanus or pertussis containing vaccine, OR has a severe allergy to any part of this vaccine, should not get Tdap vaccine. Tell the person giving the vaccine about any severe allergies.  Anyone who had coma or long repeated seizures within 7 days after a childhood dose of DTP or DTaP, or a previous dose of Tdap, should not get Tdap, unless a cause other than the vaccine was found. They can still get Td.  Talk to your doctor if you: ? have seizures or another nervous system problem, ? had severe pain or swelling after any vaccine containing diphtheria, tetanus or pertussis, ? ever had a condition called Guillain-Barr Syndrome (GBS), ? aren't feeling well on the day the shot is scheduled. 4. Risks With any medicine, including   vaccines, there is a chance of side effects. These are usually mild and go away on their own. Serious reactions are also possible but are rare. Most people who get Tdap vaccine do not have any problems with it. Mild problems following Tdap: (Did not interfere with activities)  Pain where the shot was given (about  3 in 4 adolescents or 2 in 3 adults)  Redness or swelling where the shot was given (about 1 person in 5)  Mild fever of at least 100.4F (up to about 1 in 25 adolescents or 1 in 100 adults)  Headache (about 3 or 4 people in 10)  Tiredness (about 1 person in 3 or 4)  Nausea, vomiting, diarrhea, stomach ache (up to 1 in 4 adolescents or 1 in 10 adults)  Chills, sore joints (about 1 person in 10)  Body aches (about 1 person in 3 or 4)  Rash, swollen glands (uncommon)  Moderate problems following Tdap: (Interfered with activities, but did not require medical attention)  Pain where the shot was given (up to 1 in 5 or 6)  Redness or swelling where the shot was given (up to about 1 in 16 adolescents or 1 in 12 adults)  Fever over 102F (about 1 in 100 adolescents or 1 in 250 adults)  Headache (about 1 in 7 adolescents or 1 in 10 adults)  Nausea, vomiting, diarrhea, stomach ache (up to 1 or 3 people in 100)  Swelling of the entire arm where the shot was given (up to about 1 in 500).  Severe problems following Tdap: (Unable to perform usual activities; required medical attention)  Swelling, severe pain, bleeding and redness in the arm where the shot was given (rare).  Problems that could happen after any vaccine:  People sometimes faint after a medical procedure, including vaccination. Sitting or lying down for about 15 minutes can help prevent fainting, and injuries caused by a fall. Tell your doctor if you feel dizzy, or have vision changes or ringing in the ears.  Some people get severe pain in the shoulder and have difficulty moving the arm where a shot was given. This happens very rarely.  Any medication can cause a severe allergic reaction. Such reactions from a vaccine are very rare, estimated at fewer than 1 in a million doses, and would happen within a few minutes to a few hours after the vaccination. As with any medicine, there is a very remote chance of a vaccine  causing a serious injury or death. The safety of vaccines is always being monitored. For more information, visit: www.cdc.gov/vaccinesafety/ 5. What if there is a serious problem? What should I look for? Look for anything that concerns you, such as signs of a severe allergic reaction, very high fever, or unusual behavior. Signs of a severe allergic reaction can include hives, swelling of the face and throat, difficulty breathing, a fast heartbeat, dizziness, and weakness. These would usually start a few minutes to a few hours after the vaccination. What should I do?  If you think it is a severe allergic reaction or other emergency that can't wait, call 9-1-1 or get the person to the nearest hospital. Otherwise, call your doctor.  Afterward, the reaction should be reported to the Vaccine Adverse Event Reporting System (VAERS). Your doctor might file this report, or you can do it yourself through the VAERS web site at www.vaers.hhs.gov, or by calling 1-800-822-7967. ? VAERS does not give medical advice. 6. The National Vaccine Injury Compensation Program The National   Vaccine Injury Compensation Program (VICP) is a federal program that was created to compensate people who may have been injured by certain vaccines. Persons who believe they may have been injured by a vaccine can learn about the program and about filing a claim by calling 1-800-338-2382 or visiting the VICP website at www.hrsa.gov/vaccinecompensation. There is a time limit to file a claim for compensation. 7. How can I learn more?  Ask your doctor. He or she can give you the vaccine package insert or suggest other sources of information.  Call your local or state health department.  Contact the Centers for Disease Control and Prevention (CDC): ? Call 1-800-232-4636 (1-800-CDC-INFO) or ? Visit CDC's website at www.cdc.gov/vaccines CDC Tdap Vaccine VIS (08/26/13) This information is not intended to replace advice given to you by your  health care provider. Make sure you discuss any questions you have with your health care provider. Document Released: 12/19/2011 Document Revised: 03/09/2016 Document Reviewed: 03/09/2016 Elsevier Interactive Patient Education  2017 [employer]  

## 2018-03-27 NOTE — Assessment & Plan Note (Signed)
Followed by Urology, currently on testosterone supplementation

## 2018-03-27 NOTE — Assessment & Plan Note (Signed)
Stable on OTC xyzal, continue current regimen

## 2018-03-27 NOTE — Progress Notes (Signed)
BP (!) 138/91   Pulse 71   Temp 98 F (36.7 C) (Oral)   Ht 6\' 8"  (2.032 m)   Wt (!) 353 lb (160.1 kg)   SpO2 96%   BMI 38.78 kg/m    Subjective:    Patient ID: Collin Butler, male    DOB: 1985-09-30, 32 y.o.   MRN: [id number]  HPI: Collin Butler is a 32 y.o. male presenting on 03/27/2018 for comprehensive medical examination. Current medical complaints include:see below  Daytime somnolence despite sleeping adequate hours, people around him hearing him gasping in his sleep. No previous sleep studies performed. Does not take anything for sleep.   Mass on back of neck x 2-3 years. Has not changed since first noticed and does not cause any pain. Not trying anything on it.   Xyzal OTC doing well for allergies.   Having venous issues right medial leg, vascular has offered procedure 3 years ago but declined at the time. Now wanting to proceed.   Followed by Urology for testosterone replacement for testicular hypofunction.   He currently lives with: wife Interim Problems from his last visit: no  Depression Screen done today and results listed below:  Depression screen [facility] 2/9 03/27/2018 07/12/2017 04/26/2017  Decreased Interest 0 0 0  Down, Depressed, Hopeless 0 0 0  PHQ - 2 Score 0 0 0  Altered sleeping 1 - -  Tired, decreased energy 1 - -  Change in appetite 0 - -  Feeling bad or failure about yourself  0 - -  Trouble concentrating 0 - -  Moving slowly or fidgety/restless 0 - -  Suicidal thoughts 0 - -  PHQ-9 Score 2 - -    The patient does not have a history of falls. I did not complete a risk assessment for falls. A plan of care for falls was not documented.   Past Medical History:  Past Medical History:  Diagnosis Date  . Asthma   . Hypogonadism in male   . Klinefelter syndrome   . Sleep apnea     Surgical History:  History reviewed. No pertinent surgical history.  Medications:  Current Outpatient Medications on File Prior to Visit  Medication Sig  .  ibuprofen (ADVIL,MOTRIN) 200 MG tablet Take 200 mg by mouth.  . levocetirizine (XYZAL) 5 MG tablet Take 5 mg by mouth daily.  . Testosterone 75 MG PLLT Inject 1,200 mg into the skin.   No current facility-administered medications on file prior to visit.     Allergies:  No Known Allergies  Social History:  Social History   Socioeconomic History  . Marital status: Married    Spouse name: Not on file  . Number of children: Not on file  . Years of education: Not on file  . Highest education level: Not on file  Occupational History  . Not on file  Social Needs  . Financial resource strain: Not on file  . Food insecurity:    Worry: Not on file    Inability: Not on file  . Transportation needs:    Medical: Not on file    Non-medical: Not on file  Tobacco Use  . Smoking status: Never Smoker  . Smokeless tobacco: Never Used  Substance and Sexual Activity  . Alcohol use: Yes    Comment: rare  . Drug use: No  . Sexual activity: Yes  Lifestyle  . Physical activity:    Days per week: Not on file    Minutes per session: Not  on file  . Stress: Not on file  Relationships  . Social connections:    Talks on phone: Not on file    Gets together: Not on file    Attends religious service: Not on file    Active member of club or organization: Not on file    Attends meetings of clubs or organizations: Not on file    Relationship status: Not on file  . Intimate partner violence:    Fear of current or ex partner: Not on file    Emotionally abused: Not on file    Physically abused: Not on file    Forced sexual activity: Not on file  Other Topics Concern  . Not on file  Social History Narrative  . Not on file   Social History   Tobacco Use  Smoking Status Never Smoker  Smokeless Tobacco Never Used   Social History   Substance and Sexual Activity  Alcohol Use Yes   Comment: rare    Family History:  Family History  Problem Relation Age of Onset  . Diabetes type I Sister    . Cancer Maternal Grandmother   . Hypertension Maternal Grandfather   . Prostate cancer Neg Hx   . Bladder Cancer Neg Hx   . Kidney cancer Neg Hx     Past medical history, surgical history, medications, allergies, family history and social history reviewed with patient today and changes made to appropriate areas of the chart.   Review of Systems - General ROS: positive for  - fatigue Psychological ROS: positive for - sleep disturbances Ophthalmic ROS: negative ENT ROS: negative Allergy and Immunology ROS: negative Hematological and Lymphatic ROS: negative Endocrine ROS: negative Respiratory ROS: no cough, shortness of breath, or wheezing Cardiovascular ROS: positive for - poor wound healing b/l LEs, swelling Gastrointestinal ROS: no abdominal pain, change in bowel habits, or black or bloody stools Genito-Urinary ROS: no dysuria, trouble voiding, or hematuria Musculoskeletal ROS: negative Neurological ROS: no TIA or stroke symptoms Dermatological ROS: negative All other ROS negative except what is listed above and in the HPI.      Objective:    BP (!) 138/91   Pulse 71   Temp 98 F (36.7 C) (Oral)   Ht 6\' 8"  (2.032 m)   Wt (!) 353 lb (160.1 kg)   SpO2 96%   BMI 38.78 kg/m   Wt Readings from Last 3 Encounters:  03/27/18 (!) 353 lb (160.1 kg)  01/22/18 (!) 346 lb 1.6 oz (157 kg)  09/04/17 (!) 337 lb 9.6 oz (153.1 kg)    Physical Exam  Constitutional: He is oriented to person, place, and time. He appears well-developed and well-nourished. No distress.  HENT:  Head: Atraumatic.  Right Ear: External ear normal.  Left Ear: External ear normal.  Nose: Nose normal.  Mouth/Throat: Oropharynx is clear and moist.  Eyes: Pupils are equal, round, and reactive to light. Conjunctivae are normal. No scleral icterus.  Neck: Normal range of motion. Neck supple.  Cardiovascular: Normal rate, regular rhythm, normal heart sounds and intact distal pulses.  No murmur  heard. Pulmonary/Chest: Effort normal and breath sounds normal. No respiratory distress.  Abdominal: Soft. Bowel sounds are normal. He exhibits no distension and no mass. There is no tenderness. There is no guarding.  Musculoskeletal: Normal range of motion. He exhibits no edema or tenderness.  Neurological: He is alert and oriented to person, place, and time. He has normal reflexes.  Skin: Skin is warm and dry.  Darkened,  scaly areas b/l medial lower legs above ankles from chronic venous insufficiency and poor wound healing  Smooth, mobile mass on posterior neck, nontender, non erythematous  Psychiatric: He has a normal mood and affect. His behavior is normal.  Nursing note and vitals reviewed.   Results for orders placed or performed in visit on 03/27/18  Microscopic Examination  Result Value Ref Range   WBC, UA 0-5 0 - 5 /hpf   RBC, UA None seen 0 - 2 /hpf   Epithelial Cells (non renal) CANCELED    Bacteria, UA None seen None seen/Few  UA/M w/rflx Culture, Routine  Result Value Ref Range   Specific Gravity, UA 1.015 1.005 - 1.030   pH, UA 5.5 5.0 - 7.5   Color, UA Yellow Yellow   Appearance Ur Clear Clear   Leukocytes, UA Negative Negative   Protein, UA 1+ (A) Negative/Trace   Glucose, UA Negative Negative   Ketones, UA Negative Negative   RBC, UA Negative Negative   Bilirubin, UA Negative Negative   Urobilinogen, Ur 0.2 0.2 - 1.0 mg/dL   Nitrite, UA Negative Negative   Microscopic Examination See below:       Assessment & Plan:   Problem List Items Addressed This Visit      Respiratory   Allergic rhinitis    Stable on OTC xyzal, continue current regimen        Endocrine   Testicular hypofunction    Followed by Urology, currently on testosterone supplementation       Other Visit Diagnoses    Sleep concern    -  Primary   Referral placed for sleep study   Relevant Orders   Ambulatory referral to Sleep Studies   Annual physical exam       Relevant Orders    CBC with Differential/Platelet   Comprehensive metabolic panel   Lipid Panel w/o Chol/HDL Ratio   UA/M w/rflx Culture, Routine (Completed)   Need for Tdap vaccination       Relevant Orders   Tdap vaccine greater than or equal to 7yo IM (Completed)   Flu vaccine need       Relevant Orders   Flu Vaccine QUAD 36+ mos IM (Completed)   Venous insufficiency       Referral placed back to Vascular for further management. Continue elevation of legs and use of compression stockings for now   Relevant Orders   Ambulatory referral to Vascular Surgery   Neck mass       Appears to be sebaceous cyst, non-inflamed. Will continue to monitor for now, pt to let us know he he decides on excision       Discussed aspirin prophylaxis for myocardial infarction prevention and decision was it was not indicated  LABORATORY TESTING:  Health maintenance labs ordered today as discussed above.   The natural history of prostate cancer and ongoing controversy regarding screening and potential treatment outcomes of prostate cancer has been discussed with the patient. The meaning of a false positive PSA and a false negative PSA has been discussed. He indicates understanding of the limitations of this screening test and wishes not to proceed with screening PSA testing.   IMMUNIZATIONS:   - Tdap: Tetanus vaccination status reviewed: Td vaccination indicated and given today. - Influenza: Administered today  PATIENT COUNSELING:    Sexuality: Discussed sexually transmitted diseases, partner selection, use of condoms, avoidance of unintended pregnancy  and contraceptive alternatives.   Advised to avoid cigarette smoking.  I discussed with  the patient that most people either abstain from alcohol or drink within safe limits (<=14/week and <=4 drinks/occasion for males, <=7/weeks and <= 3 drinks/occasion for females) and that the risk for alcohol disorders and other health effects rises proportionally with the number of  drinks per week and how often a drinker exceeds daily limits.  Discussed cessation/primary prevention of drug use and availability of treatment for abuse.   Diet: Encouraged to adjust caloric intake to maintain  or achieve ideal body weight, to reduce intake of dietary saturated fat and total fat, to limit sodium intake by avoiding high sodium foods and not adding table salt, and to maintain adequate dietary potassium and calcium preferably from fresh fruits, vegetables, and low-fat dairy products.    stressed the importance of regular exercise  Injury prevention: Discussed safety belts, safety helmets, smoke detector, smoking near bedding or upholstery.   Dental health: Discussed importance of regular tooth brushing, flossing, and dental visits.   Follow up plan: NEXT PREVENTATIVE PHYSICAL DUE IN 1 YEAR. Return in about 1 year (around 03/28/2019) for CPE.

## 2018-03-28 LAB — COMPREHENSIVE METABOLIC PANEL
ALBUMIN: 4 g/dL (ref 3.5–5.5)
ALK PHOS: 82 IU/L (ref 39–117)
ALT: 96 IU/L — AB (ref 0–44)
AST: 58 IU/L — AB (ref 0–40)
Albumin/Globulin Ratio: 1.3 (ref 1.2–2.2)
BILIRUBIN TOTAL: 0.6 mg/dL (ref 0.0–1.2)
BUN / CREAT RATIO: 8 — AB (ref 9–20)
BUN: 8 mg/dL (ref 6–20)
CHLORIDE: 98 mmol/L (ref 96–106)
CO2: 27 mmol/L (ref 20–29)
CREATININE: 0.95 mg/dL (ref 0.76–1.27)
Calcium: 9.2 mg/dL (ref 8.7–10.2)
GFR calc Af Amer: 122 mL/min/{1.73_m2} (ref >59)
GFR calc non Af Amer: 105 mL/min/{1.73_m2} (ref >59)
GLUCOSE: 84 mg/dL (ref 65–99)
Globulin, Total: 3 g/dL (ref 1.5–4.5)
Potassium: 4.4 mmol/L (ref 3.5–5.2)
Sodium: 139 mmol/L (ref 134–144)
Total Protein: 7 g/dL (ref 6.0–8.5)

## 2018-03-28 LAB — CBC WITH DIFFERENTIAL/PLATELET
BASOS ABS: 0.1 10*3/uL (ref 0.0–0.2)
Basos: 2 %
EOS (ABSOLUTE): 0.2 10*3/uL (ref 0.0–0.4)
Eos: 2 %
HEMOGLOBIN: 16.8 g/dL (ref 13.0–17.7)
Hematocrit: 50.2 % (ref 37.5–51.0)
IMMATURE GRANS (ABS): 0 10*3/uL (ref 0.0–0.1)
Immature Granulocytes: 0 %
LYMPHS ABS: 2.6 10*3/uL (ref 0.7–3.1)
LYMPHS: 38 %
MCH: 27.8 pg (ref 26.6–33.0)
MCHC: 33.5 g/dL (ref 31.5–35.7)
MCV: 83 fL (ref 79–97)
MONOCYTES: 7 %
Monocytes Absolute: 0.5 10*3/uL (ref 0.1–0.9)
NEUTROS ABS: 3.4 10*3/uL (ref 1.4–7.0)
Neutrophils: 51 %
PLATELETS: 221 10*3/uL (ref 150–450)
RBC: 6.05 x10E6/uL — AB (ref 4.14–5.80)
RDW: 14.7 % (ref 12.3–15.4)
WBC: 6.7 10*3/uL (ref 3.4–10.8)

## 2018-03-28 LAB — LIPID PANEL W/O CHOL/HDL RATIO
Cholesterol, Total: 142 mg/dL (ref 100–199)
HDL: 33 mg/dL — ABNORMAL LOW (ref >39)
LDL CALC: 74 mg/dL (ref 0–99)
TRIGLYCERIDES: 173 mg/dL — AB (ref 0–149)
VLDL CHOLESTEROL CAL: 35 mg/dL (ref 5–40)

## 2018-04-03 ENCOUNTER — Telehealth: Payer: Self-pay | Admitting: Family Medicine

## 2018-04-03 NOTE — Telephone Encounter (Signed)
Copied from CRM [id number]. Topic: Referral - Status >> Apr 03, 2018  8:52 AM Gean Birchwood R wrote: Patient is calling in wanting an update on referral to [facility] for his sleep apenea   Rosey Bath: Left pt a VM letting him know that I spoke with [facility] and they will be contacting him this week to schedule an appt for him to expect their call from number [phone].

## 2018-04-05 ENCOUNTER — Ambulatory Visit (INDEPENDENT_AMBULATORY_CARE_PROVIDER_SITE_OTHER): Payer: Managed Care, Other (non HMO) | Admitting: Vascular Surgery

## 2018-04-05 ENCOUNTER — Encounter (INDEPENDENT_AMBULATORY_CARE_PROVIDER_SITE_OTHER): Payer: Self-pay | Admitting: Vascular Surgery

## 2018-04-05 VITALS — BP 130/82 | HR 84 | Resp 17 | Ht >= 80 in | Wt 349.8 lb

## 2018-04-05 DIAGNOSIS — I872 Venous insufficiency (chronic) (peripheral): Secondary | ICD-10-CM | POA: Insufficient documentation

## 2018-04-05 DIAGNOSIS — M7989 Other specified soft tissue disorders: Secondary | ICD-10-CM | POA: Insufficient documentation

## 2018-04-05 DIAGNOSIS — R600 Localized edema: Secondary | ICD-10-CM | POA: Diagnosis not present

## 2018-04-05 NOTE — Assessment & Plan Note (Addendum)
Likely from venous disease 

## 2018-04-05 NOTE — Assessment & Plan Note (Signed)
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  He does not remember if they checked his left leg at that time and it has been a few years now so I think repeating reflux studies on both legs would be prudent.  I suspect he will benefit from a laser ablation on the right leg and may be on the left leg as well.  He should continue wearing his compression stockings and elevating his legs.  We will see him back with his reflux study.

## 2018-04-05 NOTE — Progress Notes (Signed)
Patient ID: Collin Butler, male   DOB: 1985-10-12, 32 y.o.   MRN: [id number]  Chief Complaint  Patient presents with  . New Patient (Initial Visit)    ref Maurice March for venous insuffciency    HPI Collin Butler is a 32 y.o. male.  I am asked to see the patient by R. Maurice March, [place] for evaluation of venous insufficiency.  The patient presents with complaints of symptomatic varicosities of the lower extremities. The patient reports a long standing history of varicosities and they have become painful over time. There was no clear inciting event or causative factor that started the symptoms.  The right leg is more severly affected. The patient elevates the legs for relief. The pain is described as venous and tiredness in the legs.  He was actually seen by my partner 3 to 4 years ago and at that time he had recommended laser ablation of the right great saphenous vein but that never came to fruition.  He has been wearing compression stockings which is helped keep the swelling under control, but the skin discoloration and pain above the ankle has slowly progressed over time. The symptoms are generally most severe in the evening, particularly when they have been on their feet for long periods of time. The patient complains of intermittent swelling as an associated symptom.  The swelling has been some better with the use of compression stockings, but it is still a problem.  The patient has no previous history of deep venous thrombosis or superficial thrombophlebitis to their knowledge.     Past Medical History:  Diagnosis Date  . Asthma   . Hypogonadism in male   . Klinefelter syndrome   . Sleep apnea     Past Surgical History:  Procedure Laterality Date  . NO PAST SURGERIES      Family History  Problem Relation Age of Onset  . Diabetes type I Sister   . Cancer Maternal Grandmother   . Hypertension Maternal Grandfather   . Prostate cancer Neg Hx   . Bladder Cancer Neg Hx   . Kidney cancer Neg Hx        Social History Social History   Tobacco Use  . Smoking status: Never Smoker  . Smokeless tobacco: Never Used  Substance Use Topics  . Alcohol use: Yes    Comment: rare  . Drug use: No     No Known Allergies  Current Outpatient Medications  Medication Sig Dispense Refill  . ibuprofen (ADVIL,MOTRIN) 200 MG tablet Take 200 mg by mouth.    . levocetirizine (XYZAL) 5 MG tablet Take 5 mg by mouth daily.    . Testosterone 75 MG PLLT Inject 1,200 mg into the skin.     No current facility-administered medications for this visit.       REVIEW OF SYSTEMS (Negative unless checked)  Constitutional: [] Weight loss  [] Fever  [] Chills Cardiac: [] Chest pain   [] Chest pressure   [] Palpitations   [] Shortness of breath when laying flat   [] Shortness of breath at rest   [] Shortness of breath with exertion. Vascular:  [] Pain in legs with walking   [] Pain in legs at rest   [] Pain in legs when laying flat   [] Claudication   [] Pain in feet when walking  [] Pain in feet at rest  [] Pain in feet when laying flat   [] History of DVT   [] Phlebitis   [x] Swelling in legs   [x] Varicose veins   [] Non-healing ulcers Pulmonary:   [] Uses home oxygen   []   Productive cough   [] Hemoptysis   [] Wheeze  [] COPD   [x] Asthma Neurologic:  [] Dizziness  [] Blackouts   [] Seizures   [] History of stroke   [] History of TIA  [] Aphasia   [] Temporary blindness   [] Dysphagia   [] Weakness or numbness in arms   [] Weakness or numbness in legs Musculoskeletal:  [] Arthritis   [] Joint swelling   [] Joint pain   [] Low back pain Hematologic:  [] Easy bruising  [] Easy bleeding   [] Hypercoagulable state   [] Anemic  [] Hepatitis Gastrointestinal:  [] Blood in stool   [] Vomiting blood  [] Gastroesophageal reflux/heartburn   [] Abdominal pain Genitourinary:  [] Chronic kidney disease   [] Difficult urination  [] Frequent urination  [] Burning with urination   [] Hematuria Skin:  [] Rashes   [] Ulcers   [] Wounds Psychological:  [] History of anxiety   []   History of major depression.    Physical Exam BP 130/82 (BP Location: Right Arm)   Pulse 84   Resp 17   Ht 6\' 8"  (2.032 m)   Wt (!) 349 lb 12.8 oz (158.7 kg)   BMI 38.43 kg/m  Gen:  WD/WN, very tall male in no apparent distress Head: [place]/AT, No temporalis wasting.  Ear/Nose/Throat: Hearing grossly intact Eyes: Sclera non-icteric. Conjunctiva clear Neck: Supple. Trachea midline Pulmonary:  Good air movement, no use of accessory muscles, respirations not labored.  Cardiac: RRR, No JVD Vascular: Varicosities scattered and measuring up to 1-2 mm in the right lower extremity        Varicosities scattered and measuring up to 1-2 mm in the left lower extremity Vessel Right Left  Radial Palpable Palpable                          PT Palpable Palpable  DP Palpable Palpable   Gastrointestinal: soft, non-tender/non-distended.  Musculoskeletal: M/S 5/5 throughout.   Moderate to severe stasis changes on the right medial ankle and lower leg.  Moderate stasis changes on the left medial ankle and lower leg.  Trace bilateral lower extremity edema  Neurologic: Sensation grossly intact in extremities.  Symmetrical.  Speech is fluent.  Psychiatric: Judgment intact, Mood & affect appropriate for pt's clinical situation. Dermatologic: No rashes or ulcers noted.  No cellulitis or open wounds.    Radiology No results found.  Labs Recent Results (from the past 2160 hour(s))  Testosterone     Status: None   Collection Time: 01/08/18  8:55 AM  Result Value Ref Range   Testosterone 334 264 - 916 ng/dL    Comment: Adult male reference interval is based on a population of healthy nonobese males (BMI <30) between 55 and 60 years old. Travison, et.al. JCEM [phone]. PMID: [id number].   UA/M w/rflx Culture, Routine     Status: Abnormal   Collection Time: 03/27/18  8:38 AM  Result Value Ref Range   Specific Gravity, UA 1.015 1.005 - 1.030   pH, UA 5.5 5.0 - 7.5   Color, UA Yellow Yellow    Appearance Ur Clear Clear   Leukocytes, UA Negative Negative   Protein, UA 1+ (A) Negative/Trace   Glucose, UA Negative Negative   Ketones, UA Negative Negative   RBC, UA Negative Negative   Bilirubin, UA Negative Negative   Urobilinogen, Ur 0.2 0.2 - 1.0 mg/dL   Nitrite, UA Negative Negative   Microscopic Examination See below:   Microscopic Examination     Status: None   Collection Time: 03/27/18  8:38 AM  Result Value Ref Range   WBC,  UA 0-5 0 - 5 /hpf   RBC, UA None seen 0 - 2 /hpf   Epithelial Cells (non renal) CANCELED     Comment: Test not performed  Result canceled by the ancillary.    Bacteria, UA None seen None seen/Few  CBC with Differential/Platelet     Status: Abnormal   Collection Time: 03/27/18  8:40 AM  Result Value Ref Range   WBC 6.7 3.4 - 10.8 x10E3/uL   RBC 6.05 (H) 4.14 - 5.80 x10E6/uL   Hemoglobin 16.8 13.0 - 17.7 g/dL   Hematocrit 16.1 09.6 - 51.0 %   MCV 83 79 - 97 fL   MCH 27.8 26.6 - 33.0 pg   MCHC 33.5 31.5 - 35.7 g/dL   RDW 04.5 40.9 - 81.1 %   Platelets 221 150 - 450 x10E3/uL   Neutrophils 51 Not Estab. %   Lymphs 38 Not Estab. %   Monocytes 7 Not Estab. %   Eos 2 Not Estab. %   Basos 2 Not Estab. %   Neutrophils Absolute 3.4 1.4 - 7.0 x10E3/uL   Lymphocytes Absolute 2.6 0.7 - 3.1 x10E3/uL   Monocytes Absolute 0.5 0.1 - 0.9 x10E3/uL   EOS (ABSOLUTE) 0.2 0.0 - 0.4 x10E3/uL   Basophils Absolute 0.1 0.0 - 0.2 x10E3/uL   Immature Granulocytes 0 Not Estab. %   Immature Grans (Abs) 0.0 0.0 - 0.1 x10E3/uL  Comprehensive metabolic panel     Status: Abnormal   Collection Time: 03/27/18  8:40 AM  Result Value Ref Range   Glucose 84 65 - 99 mg/dL   BUN 8 6 - 20 mg/dL   Creatinine, Ser 9.14 0.76 - 1.27 mg/dL   GFR calc non Af Amer 105 >59 mL/min/1.73   GFR calc Af Amer 122 >59 mL/min/1.73   BUN/Creatinine Ratio 8 (L) 9 - 20   Sodium 139 134 - 144 mmol/L   Potassium 4.4 3.5 - 5.2 mmol/L   Chloride 98 96 - 106 mmol/L   CO2 27 20 - 29 mmol/L     Calcium 9.2 8.7 - 10.2 mg/dL   Total Protein 7.0 6.0 - 8.5 g/dL   Albumin 4.0 3.5 - 5.5 g/dL   Globulin, Total 3.0 1.5 - 4.5 g/dL   Albumin/Globulin Ratio 1.3 1.2 - 2.2   Bilirubin Total 0.6 0.0 - 1.2 mg/dL   Alkaline Phosphatase 82 39 - 117 IU/L   AST 58 (H) 0 - 40 IU/L   ALT 96 (H) 0 - 44 IU/L  Lipid Panel w/o Chol/HDL Ratio     Status: Abnormal   Collection Time: 03/27/18  8:40 AM  Result Value Ref Range   Cholesterol, Total 142 100 - 199 mg/dL   Triglycerides 782 (H) 0 - 149 mg/dL   HDL 33 (L) >95 mg/dL   VLDL Cholesterol Cal 35 5 - 40 mg/dL   LDL Calculated 74 0 - 99 mg/dL    Assessment/Plan:  Swelling of limb Likely from venous disease  Chronic venous insufficiency The patient has clear symptoms of chronic venous insufficiency and has previously been diagnosed with venous reflux.  He does not remember if they checked his left leg at that time and it has been a few years now so I think repeating reflux studies on both legs would be prudent.  I suspect he will benefit from a laser ablation on the right leg and may be on the left leg as well.  He should continue wearing his compression stockings and elevating his legs.  We will see him back with his reflux study.        Festus Barren 04/05/2018, 1:53 PM   This note was created with Dragon medical transcription system.  Any errors from dictation are unintentional.

## 2018-04-25 ENCOUNTER — Other Ambulatory Visit: Payer: Managed Care, Other (non HMO)

## 2018-04-25 DIAGNOSIS — E291 Testicular hypofunction: Secondary | ICD-10-CM

## 2018-04-26 ENCOUNTER — Telehealth: Payer: Self-pay | Admitting: Family Medicine

## 2018-04-26 LAB — TESTOSTERONE: Testosterone: 594 ng/dL (ref 264–916)

## 2018-04-26 LAB — HEMATOCRIT: HEMATOCRIT: 52.4 % — AB (ref 37.5–51.0)

## 2018-04-26 LAB — PSA: Prostate Specific Ag, Serum: 0.4 ng/mL (ref 0.0–4.0)

## 2018-04-26 NOTE — Telephone Encounter (Signed)
LMOM for patient to return call.

## 2018-04-26 NOTE — Telephone Encounter (Signed)
-----   Message from Riki Altes, MD sent at 04/26/2018  8:48 AM EDT ----- Testosterone level looks good at 594.  Hematocrit is elevated at 52.4 which is most likely secondary to the testosterone.  Recommend he donate blood in order to decrease his hematocrit.  Follow-up as scheduled

## 2018-04-29 NOTE — Telephone Encounter (Signed)
Pt informed, will donate blood.

## 2018-05-03 ENCOUNTER — Ambulatory Visit (INDEPENDENT_AMBULATORY_CARE_PROVIDER_SITE_OTHER): Payer: Managed Care, Other (non HMO) | Admitting: Vascular Surgery

## 2018-05-03 ENCOUNTER — Encounter (INDEPENDENT_AMBULATORY_CARE_PROVIDER_SITE_OTHER): Payer: Self-pay | Admitting: Vascular Surgery

## 2018-05-03 ENCOUNTER — Ambulatory Visit (INDEPENDENT_AMBULATORY_CARE_PROVIDER_SITE_OTHER): Payer: Managed Care, Other (non HMO)

## 2018-05-03 VITALS — BP 128/68 | HR 69 | Resp 18 | Ht >= 80 in | Wt 345.0 lb

## 2018-05-03 DIAGNOSIS — I872 Venous insufficiency (chronic) (peripheral): Secondary | ICD-10-CM | POA: Diagnosis not present

## 2018-05-03 NOTE — Progress Notes (Signed)
MRN : [id number]  Collin Butler is a 32 y.o. (January 13, 1986) male who presents with chief complaint of  Chief Complaint  Patient presents with  . Follow-up    Bilateral Reflux u/s f/u  .  History of Present Illness: Patient returns today in follow up of his venous insufficiency.  Despite appropriate conservative therapies of compression stockings, leg elevation, and anti-inflammatories as needed for pain, patient continues to have pain and swelling in both legs.  The right leg bothers him a little more than the left.  The stasis dermatitis changes are about the same as they were at his initial visit.  Duplex today shows no evidence of deep venous thrombosis or superficial thrombophlebitis.  Bilateral long segment great saphenous vein reflux was identified.  Current Outpatient Medications  Medication Sig Dispense Refill  . ibuprofen (ADVIL,MOTRIN) 200 MG tablet Take 200 mg by mouth.    . levocetirizine (XYZAL) 5 MG tablet Take 5 mg by mouth daily.    . Testosterone 75 MG PLLT Inject 1,200 mg into the skin.     No current facility-administered medications for this visit.     Past Medical History:  Diagnosis Date  . Asthma   . Hypogonadism in male   . Klinefelter syndrome   . Peripheral vascular disease (HCC)   . Sleep apnea     Past Surgical History:  Procedure Laterality Date  . NO PAST SURGERIES      Family History  Problem Relation Age of Onset  . Diabetes type I Sister   . Cancer Maternal Grandmother   . Hypertension Maternal Grandfather   . Prostate cancer Neg Hx   . Bladder Cancer Neg Hx   . Kidney cancer Neg Hx      Social History Social History        Tobacco Use  . Smoking status: Never Smoker  . Smokeless tobacco: Never Used  Substance Use Topics  . Alcohol use: Yes    Comment: rare  . Drug use: No     No Known Allergies     REVIEW OF SYSTEMS (Negative unless checked)  Constitutional: [] Weight loss  [] Fever   [] Chills Cardiac: [] Chest pain   [] Chest pressure   [] Palpitations   [] Shortness of breath when laying flat   [] Shortness of breath at rest   [] Shortness of breath with exertion. Vascular:  [] Pain in legs with walking   [] Pain in legs at rest   [] Pain in legs when laying flat   [] Claudication   [] Pain in feet when walking  [] Pain in feet at rest  [] Pain in feet when laying flat   [] History of DVT   [] Phlebitis   [x] Swelling in legs   [x] Varicose veins   [] Non-healing ulcers Pulmonary:   [] Uses home oxygen   [] Productive cough   [] Hemoptysis   [] Wheeze  [] COPD   [x] Asthma Neurologic:  [] Dizziness  [] Blackouts   [] Seizures   [] History of stroke   [] History of TIA  [] Aphasia   [] Temporary blindness   [] Dysphagia   [] Weakness or numbness in arms   [] Weakness or numbness in legs Musculoskeletal:  [] Arthritis   [] Joint swelling   [] Joint pain   [] Low back pain Hematologic:  [] Easy bruising  [] Easy bleeding   [] Hypercoagulable state   [] Anemic  [] Hepatitis Gastrointestinal:  [] Blood in stool   [] Vomiting blood  [] Gastroesophageal reflux/heartburn   [] Abdominal pain Genitourinary:  [] Chronic kidney disease   [] Difficult urination  [] Frequent urination  [] Burning with urination   [] Hematuria Skin:  []   Rashes   [] Ulcers   [] Wounds Psychological:  [] History of anxiety   []  History of major depression.     Physical Examination  BP 128/68 (BP Location: Right Arm, Patient Position: Sitting)   Pulse 69   Resp 18   Ht 6\' 8"  (2.032 m)   Wt (!) 345 lb (156.5 kg)   BMI 37.90 kg/m  Gen:  WD/WN, NAD.  Very large man Head: [place]/AT, No temporalis wasting. Ear/Nose/Throat: Hearing grossly intact, nares w/o erythema or drainage Eyes: Conjunctiva clear. Sclera non-icteric Neck: Supple.  Trachea midline Pulmonary:  Good air movement, no use of accessory muscles.  Cardiac: RRR, no JVD Vascular: Scattered varicosities bilaterally.  Moderate to severe stasis dermatitis changes on the right medial lower leg.   Moderate stasis dermatitis changes on the left medial lower leg Vessel Right Left  Radial Palpable Palpable                          PT Palpable Palpable  DP Palpable Palpable   Gastrointestinal: soft, non-tender/non-distended. No guarding/reflex.  Musculoskeletal: M/S 5/5 throughout.  No deformity or atrophy.  Trace bilateral lower extremity edema. Neurologic: Sensation grossly intact in extremities.  Symmetrical.  Speech is fluent.  Psychiatric: Judgment intact, Mood & affect appropriate for pt's clinical situation. Dermatologic: No rashes or ulcers noted.  No cellulitis or open wounds.       Labs Recent Results (from the past 2160 hour(s))  UA/M w/rflx Culture, Routine     Status: Abnormal   Collection Time: 03/27/18  8:38 AM  Result Value Ref Range   Specific Gravity, UA 1.015 1.005 - 1.030   pH, UA 5.5 5.0 - 7.5   Color, UA Yellow Yellow   Appearance Ur Clear Clear   Leukocytes, UA Negative Negative   Protein, UA 1+ (A) Negative/Trace   Glucose, UA Negative Negative   Ketones, UA Negative Negative   RBC, UA Negative Negative   Bilirubin, UA Negative Negative   Urobilinogen, Ur 0.2 0.2 - 1.0 mg/dL   Nitrite, UA Negative Negative   Microscopic Examination See below:   Microscopic Examination     Status: None   Collection Time: 03/27/18  8:38 AM  Result Value Ref Range   WBC, UA 0-5 0 - 5 /hpf   RBC, UA None seen 0 - 2 /hpf   Epithelial Cells (non renal) CANCELED     Comment: Test not performed  Result canceled by the ancillary.    Bacteria, UA None seen None seen/Few  CBC with Differential/Platelet     Status: Abnormal   Collection Time: 03/27/18  8:40 AM  Result Value Ref Range   WBC 6.7 3.4 - 10.8 x10E3/uL   RBC 6.05 (H) 4.14 - 5.80 x10E6/uL   Hemoglobin 16.8 13.0 - 17.7 g/dL   Hematocrit 95.2 84.1 - 51.0 %   MCV 83 79 - 97 fL   MCH 27.8 26.6 - 33.0 pg   MCHC 33.5 31.5 - 35.7 g/dL   RDW 32.4 40.1 - 02.7 %   Platelets 221 150 - 450 x10E3/uL    Neutrophils 51 Not Estab. %   Lymphs 38 Not Estab. %   Monocytes 7 Not Estab. %   Eos 2 Not Estab. %   Basos 2 Not Estab. %   Neutrophils Absolute 3.4 1.4 - 7.0 x10E3/uL   Lymphocytes Absolute 2.6 0.7 - 3.1 x10E3/uL   Monocytes Absolute 0.5 0.1 - 0.9 x10E3/uL   EOS (ABSOLUTE) 0.2 0.0 -  0.4 x10E3/uL   Basophils Absolute 0.1 0.0 - 0.2 x10E3/uL   Immature Granulocytes 0 Not Estab. %   Immature Grans (Abs) 0.0 0.0 - 0.1 x10E3/uL  Comprehensive metabolic panel     Status: Abnormal   Collection Time: 03/27/18  8:40 AM  Result Value Ref Range   Glucose 84 65 - 99 mg/dL   BUN 8 6 - 20 mg/dL   Creatinine, Ser 8.11 0.76 - 1.27 mg/dL   GFR calc non Af Amer 105 >59 mL/min/1.73   GFR calc Af Amer 122 >59 mL/min/1.73   BUN/Creatinine Ratio 8 (L) 9 - 20   Sodium 139 134 - 144 mmol/L   Potassium 4.4 3.5 - 5.2 mmol/L   Chloride 98 96 - 106 mmol/L   CO2 27 20 - 29 mmol/L   Calcium 9.2 8.7 - 10.2 mg/dL   Total Protein 7.0 6.0 - 8.5 g/dL   Albumin 4.0 3.5 - 5.5 g/dL   Globulin, Total 3.0 1.5 - 4.5 g/dL   Albumin/Globulin Ratio 1.3 1.2 - 2.2   Bilirubin Total 0.6 0.0 - 1.2 mg/dL   Alkaline Phosphatase 82 39 - 117 IU/L   AST 58 (H) 0 - 40 IU/L   ALT 96 (H) 0 - 44 IU/L  Lipid Panel w/o Chol/HDL Ratio     Status: Abnormal   Collection Time: 03/27/18  8:40 AM  Result Value Ref Range   Cholesterol, Total 142 100 - 199 mg/dL   Triglycerides 914 (H) 0 - 149 mg/dL   HDL 33 (L) >78 mg/dL   VLDL Cholesterol Cal 35 5 - 40 mg/dL   LDL Calculated 74 0 - 99 mg/dL  Hematocrit     Status: Abnormal   Collection Time: 04/25/18  1:34 PM  Result Value Ref Range   Hematocrit 52.4 (H) 37.5 - 51.0 %  Testosterone     Status: None   Collection Time: 04/25/18  1:34 PM  Result Value Ref Range   Testosterone 594 264 - 916 ng/dL    Comment: Adult male reference interval is based on a population of healthy nonobese males (BMI <30) between 57 and 24 years old. Travison, et.al. JCEM [phone]. PMID:  [id number].   PSA     Status: None   Collection Time: 04/25/18  1:34 PM  Result Value Ref Range   Prostate Specific Ag, Serum 0.4 0.0 - 4.0 ng/mL    Comment: Roche ECLIA methodology. According to the American U[employer] Association, Serum PSA should decrease and remain at undetectable levels after radical prostatectomy. The AUA defines biochemical recurrence as an initial PSA value 0.2 ng/mL or greater followed by a subsequent confirmatory PSA value 0.2 ng/mL or greater. Values obtained with different assay methods or kits cannot be used interchangeably. Results cannot be interpreted as absolute evidence of the presence or absence of malignant disease.     Radiology No results found.  Assessment/Plan  Chronic venous insufficiency Recommend  I have reviewed my previous  discussion with the patient regarding  varicose veins and why they cause symptoms. Patient will continue  wearing graduated compression stockings class 1 on a daily basis, beginning first thing in the morning and removing them in the evening.    In addition, behavioral modification including elevation during the day was again discussed and this will continue.  The patient has utilized over the counter pain medications and has been exercising.  However, at this time conservative therapy has not alleviated the patient's symptoms of leg pain and swelling  Recommend: laser  ablation of the right and left great saphenous veins to eliminate the symptoms of pain and swelling of the lower extremities caused by the severe superficial venous reflux disease.     Festus Barren, MD  05/03/2018 9:19 AM    This note was created with Dragon medical transcription system.  Any errors from dictation are purely unintentional

## 2018-05-03 NOTE — Assessment & Plan Note (Signed)
Recommend  I have reviewed my previous  discussion with the patient regarding  varicose veins and why they cause symptoms. Patient will continue  wearing graduated compression stockings class 1 on a daily basis, beginning first thing in the morning and removing them in the evening.    In addition, behavioral modification including elevation during the day was again discussed and this will continue.  The patient has utilized over the counter pain medications and has been exercising.  However, at this time conservative therapy has not alleviated the patient's symptoms of leg pain and swelling  Recommend: laser ablation of the right and  left great saphenous veins to eliminate the symptoms of pain and swelling of the lower extremities caused by the severe superficial venous reflux disease.  

## 2018-05-03 NOTE — Patient Instructions (Signed)
Nonsurgical Procedures for Varicose Veins Various nonsurgical procedures can be used to treat varicose veins. Varicose veins are swollen, twisted veins that are visible under the skin. They occur most often in the legs. These veins may appear blue and bulging. Varicose veins are caused by damage to the valves in veins. All veins have a valve that makes blood flow in only one direction. If a valve gets weak or damaged, blood can pool and cause varicose veins. You may need a procedure to treat your varicose veins if they are causing symptoms or complications, or if lifestyle changes have not helped. These procedures can reduce pain, aching, and the risk of bleeding and blood clots. They can also improve the way the affected area looks (cosmetic appearance). The three common nonsurgical procedures are:  Sclerotherapy. A chemical is injected to close off a vein.  Laser treatment. Light energy is applied to close off the vein.  Radiofrequency vein ablation. Electrical energy is used to produce heat that closes off the vein.  Your health care provider will discuss the method that is best for you based on your condition. Tell a health care provider about:  Any allergies you have.  All medicines you are taking, including vitamins, herbs, eye drops, creams, and over-the-counter medicines.  Any problems you or family members have had with anesthetic medicines.  Any blood disorders you have.  Any surgeries you have had.  Any medical conditions you have.  Whether you are pregnant or may be pregnant. What are the risks? Generally, this is a safe procedure. However, problems may occur, including:  Damage to nearby nerves, tissues, or veins.  Skin irritation, sores, or dark spots.  Numbness.  Clotting.  Infection.  Allergic reactions to medicines.  Scarring.  Leg swelling.  Need for additional treatments.  Bruising.  What happens before the procedure?  Ask your health care  provider about: ? Changing or stopping your regular medicines. This is especially important if you are taking diabetes medicines or blood thinners. ? Taking over-the-counter medicines, vitamins, herbs, and supplements. ? Taking medicines such as aspirin and ibuprofen. These medicines can thin your blood. Do not take these medicines unless your health care provider tells you to take them.  You may have an exam or testing. This can include a tests to: ? Check for clots and check blood flow using sound waves (Doppler ultrasound). ? Observe how blood flows through your veins by injecting a dye that outlines your veins on X-rays (angiogram). This test is used in rare cases. What happens during the procedure? One of the following procedures will be performed: Sclerotherapy This procedure is often used for small to medium veins.  A chemical (sclerosant) that irritates the lining of the vein will be injected into the vein. This will cause the varicose vein to be closed off. Sclerosants in different amounts and strengths can be used, depending on the size and location of the vein.  All of the varicose vein sites will be injected. You may need more than one treatment because new varicose veins may develop, or more than one injection may be needed for each varicose vein.  Laser treatment There are two ways that lasers are used to treat varicose veins:  Light energy from a laser may be directed onto the vein through the skin.  A needle may be used to pass a thin laser catheter into the vein to cause it to close.  You may need more than one treatment if the vein re-opens.   In some cases, laser treatment may be combined with sclerotherapy. Radiofrequency vein ablation  You will be given a medicine that numbs the area (local anesthetic).  A small incision will be made near the varicose vein.  A thin tube (catheter) will be threaded into your vein.  The tip of the catheter will deploy  electrodes.  The electrodes will deliver electrical energy to produce heat that closes off the vein. What happens after the procedure?  A bandage (dressing) may be used to cover the injection site or incisions.  You may have to wear compression stockings. These stockings help to prevent blood clots and reduce swelling in your legs.  Return to your normal activities as told by your health care provider. Summary  Varicose veins are swollen, twisted veins that are visible under the skin. They occur most often in the legs.  Various procedures can be used to treat varicose veins. You may need a procedure to treat your varicose veins if they are causing symptoms or complications, or if lifestyle changes have not helped.  Your health care provider will discuss the method that is best for you based on your condition. This information is not intended to replace advice given to you by your health care provider. Make sure you discuss any questions you have with your health care provider. Document Released: 09/29/2016 Document Revised: 09/29/2016 Document Reviewed: 09/29/2016 Elsevier Interactive Patient Education  2018 [employer]  

## 2018-05-22 ENCOUNTER — Ambulatory Visit: Payer: Managed Care, Other (non HMO) | Admitting: Urology

## 2018-05-22 ENCOUNTER — Encounter: Payer: Self-pay | Admitting: Urology

## 2018-05-22 VITALS — BP 157/85 | HR 108 | Ht >= 80 in | Wt 358.0 lb

## 2018-05-22 DIAGNOSIS — E291 Testicular hypofunction: Secondary | ICD-10-CM | POA: Diagnosis not present

## 2018-05-22 NOTE — Progress Notes (Signed)
Procedure note: Testopel  Diagnosis: Hypogonadism  Physician: Riki AltesScott C Stoioff, MD  Assistant: Debbe Baleskinawa Glanton, CMA  Anesthetic: 1% plain Xylocaine with epinephrine  Description: A site on the upper outer right buttock was selected and prepped with Betadine and draped in the usual fashion.  The site was anesthetized with 8 mL of the above anesthetic in a fan shaped configuration.  A stab incision was made with 11 blade and spread with forceps.  Using the implantation trocar 18 pellets were placed in 4 rows.  The site was closed with 5-0 Vicryl and dressed with gauze and Tegaderm.  Body weight pressure was placed for 5 minutes.  Complications: None  EBL: Minimal  Discharge instructions: 1.  May shower in 24 hours.  No swimming, tub bath X 1 week. 2.  Call for redness, drainage, increasing pain or fever greater than 101 degrees. 3.  Follow-up 3 months for a testosterone level and hematocrit.  Tentative repeat Testopel 4 months.   Collin AxonScott Stoioff, MD

## 2018-05-23 ENCOUNTER — Encounter: Payer: Self-pay | Admitting: Urology

## 2018-06-28 ENCOUNTER — Other Ambulatory Visit (INDEPENDENT_AMBULATORY_CARE_PROVIDER_SITE_OTHER): Payer: Managed Care, Other (non HMO) | Admitting: Vascular Surgery

## 2018-07-12 ENCOUNTER — Ambulatory Visit (INDEPENDENT_AMBULATORY_CARE_PROVIDER_SITE_OTHER): Payer: Managed Care, Other (non HMO) | Admitting: Vascular Surgery

## 2018-07-12 VITALS — BP 132/83 | HR 75 | Resp 16 | Ht >= 80 in | Wt 352.0 lb

## 2018-07-12 DIAGNOSIS — L97909 Non-pressure chronic ulcer of unspecified part of unspecified lower leg with unspecified severity: Secondary | ICD-10-CM | POA: Insufficient documentation

## 2018-07-12 DIAGNOSIS — I83009 Varicose veins of unspecified lower extremity with ulcer of unspecified site: Secondary | ICD-10-CM | POA: Insufficient documentation

## 2018-07-12 DIAGNOSIS — L97311 Non-pressure chronic ulcer of right ankle limited to breakdown of skin: Secondary | ICD-10-CM

## 2018-07-12 DIAGNOSIS — I83013 Varicose veins of right lower extremity with ulcer of ankle: Secondary | ICD-10-CM

## 2018-07-12 NOTE — Progress Notes (Signed)
Collin Butler is a 33 y.o. male who presents with symptomatic venous reflux  Past Medical History:  Diagnosis Date  . Asthma   . Hypogonadism in male   . Klinefelter syndrome   . Peripheral vascular disease (HCC)   . Sleep apnea     Past Surgical History:  Procedure Laterality Date  . NO PAST SURGERIES       Current Outpatient Medications:  .  ibuprofen (ADVIL,MOTRIN) 200 MG tablet, Take 200 mg by mouth., Disp: , Rfl:  .  levocetirizine (XYZAL) 5 MG tablet, Take 5 mg by mouth daily., Disp: , Rfl:  .  Testosterone 75 MG PLLT, Inject 1,200 mg into the skin., Disp: , Rfl:   No Known Allergies    ASSESSMENT: varicose veins with ulcer RLE, pain and swelling BLE  PLAN: The patient's right lower extremity was sterilely prepped and draped. The ultrasound machine was used to visualize the saphenous vein throughout its course. A segment in the upper calf was selected for access. The saphenous vein was accessed without difficulty using ultrasound guidance with a micropuncture needle. A 0.018 wire was then placed beyond the saphenofemoral junction and the needle was removed. The 65 cm sheath was then placed over the wire and the wire and dilator were removed. The laser fiber was then placed through the sheath and its tip was placed approximately 5 centimeters below the saphenofemoral junction. Tumescent anesthesia was then created with a dilute lidocaine solution. Laser energy was then delivered with constant withdrawal of the sheath and laser fiber. Approximately 1732 joules of energy were delivered over a length of 47 centimeters using a 1470 Hz VenaCure machine at 7 W. Sterile dressings were placed. The patient tolerated the procedure well without obvious complications.   Follow-up in 1 week with post-laser duplex.

## 2018-07-15 ENCOUNTER — Encounter (INDEPENDENT_AMBULATORY_CARE_PROVIDER_SITE_OTHER): Payer: Managed Care, Other (non HMO)

## 2018-07-16 ENCOUNTER — Ambulatory Visit (INDEPENDENT_AMBULATORY_CARE_PROVIDER_SITE_OTHER): Payer: Managed Care, Other (non HMO)

## 2018-07-16 ENCOUNTER — Other Ambulatory Visit (INDEPENDENT_AMBULATORY_CARE_PROVIDER_SITE_OTHER): Payer: Self-pay | Admitting: Vascular Surgery

## 2018-07-16 DIAGNOSIS — Z9889 Other specified postprocedural states: Secondary | ICD-10-CM

## 2018-07-16 DIAGNOSIS — I83013 Varicose veins of right lower extremity with ulcer of ankle: Secondary | ICD-10-CM

## 2018-07-26 ENCOUNTER — Ambulatory Visit (INDEPENDENT_AMBULATORY_CARE_PROVIDER_SITE_OTHER): Payer: Managed Care, Other (non HMO) | Admitting: Vascular Surgery

## 2018-07-26 ENCOUNTER — Encounter (INDEPENDENT_AMBULATORY_CARE_PROVIDER_SITE_OTHER): Payer: Self-pay | Admitting: Vascular Surgery

## 2018-07-26 VITALS — BP 144/85 | HR 94 | Resp 16 | Ht >= 80 in | Wt 348.6 lb

## 2018-07-26 DIAGNOSIS — I83812 Varicose veins of left lower extremities with pain: Secondary | ICD-10-CM | POA: Diagnosis not present

## 2018-07-26 NOTE — Progress Notes (Signed)
Collin Butler is a 33 y.o. male who presents with symptomatic venous reflux  Past Medical History:  Diagnosis Date  . Asthma   . Hypogonadism in male   . Klinefelter syndrome   . Peripheral vascular disease (HCC)   . Sleep apnea     Past Surgical History:  Procedure Laterality Date  . NO PAST SURGERIES       Current Outpatient Medications:  .  ibuprofen (ADVIL,MOTRIN) 200 MG tablet, Take 200 mg by mouth., Disp: , Rfl:  .  levocetirizine (XYZAL) 5 MG tablet, Take 5 mg by mouth daily., Disp: , Rfl:  .  Testosterone 75 MG PLLT, Inject 1,200 mg into the skin., Disp: , Rfl:   No Known Allergies   Varicose veins of leg with pain, left     PLAN: The patient's left lower extremity was sterilely prepped and draped. The ultrasound machine was used to visualize the saphenous vein throughout its course. A segment in the upper calf was selected for access. The saphenous vein was accessed without difficulty using ultrasound guidance with a micropuncture needle. A 0.018 wire was then placed beyond the saphenofemoral junction and the needle was removed. The 65 cm sheath was then placed over the wire and the wire and dilator were removed. The laser fiber was then placed through the sheath and its tip was placed approximately 5 centimeters below the saphenofemoral junction. Tumescent anesthesia was then created with a dilute lidocaine solution. Laser energy was then delivered with constant withdrawal of the sheath and laser fiber. Approximately 1741 joules of energy were delivered over a length of 46 centimeters using a 1470 Hz VenaCure machine at 7 W. Sterile dressings were placed. The patient tolerated the procedure well without obvious complications.   Follow-up in 1 week with post-laser duplex.

## 2018-07-29 ENCOUNTER — Ambulatory Visit (INDEPENDENT_AMBULATORY_CARE_PROVIDER_SITE_OTHER): Payer: Managed Care, Other (non HMO)

## 2018-07-29 ENCOUNTER — Encounter (INDEPENDENT_AMBULATORY_CARE_PROVIDER_SITE_OTHER): Payer: Self-pay | Admitting: Vascular Surgery

## 2018-07-29 DIAGNOSIS — I83812 Varicose veins of left lower extremities with pain: Secondary | ICD-10-CM

## 2018-08-20 ENCOUNTER — Other Ambulatory Visit: Payer: Managed Care, Other (non HMO)

## 2018-08-20 DIAGNOSIS — E291 Testicular hypofunction: Secondary | ICD-10-CM

## 2018-08-21 LAB — HEMATOCRIT: HEMATOCRIT: 51.9 % — AB (ref 37.5–51.0)

## 2018-08-21 LAB — TESTOSTERONE: Testosterone: 490 ng/dL (ref 264–916)

## 2018-08-22 ENCOUNTER — Ambulatory Visit (INDEPENDENT_AMBULATORY_CARE_PROVIDER_SITE_OTHER): Payer: Managed Care, Other (non HMO) | Admitting: Nurse Practitioner

## 2018-08-22 ENCOUNTER — Encounter (INDEPENDENT_AMBULATORY_CARE_PROVIDER_SITE_OTHER): Payer: Self-pay | Admitting: Nurse Practitioner

## 2018-08-22 ENCOUNTER — Telehealth: Payer: Self-pay

## 2018-08-22 VITALS — BP 121/80 | HR 69 | Resp 16 | Ht >= 80 in | Wt 353.8 lb

## 2018-08-22 DIAGNOSIS — L97311 Non-pressure chronic ulcer of right ankle limited to breakdown of skin: Secondary | ICD-10-CM | POA: Diagnosis not present

## 2018-08-22 DIAGNOSIS — I83013 Varicose veins of right lower extremity with ulcer of ankle: Secondary | ICD-10-CM | POA: Diagnosis not present

## 2018-08-22 DIAGNOSIS — J4521 Mild intermittent asthma with (acute) exacerbation: Secondary | ICD-10-CM

## 2018-08-22 DIAGNOSIS — M7989 Other specified soft tissue disorders: Secondary | ICD-10-CM | POA: Diagnosis not present

## 2018-08-22 NOTE — Telephone Encounter (Signed)
Called and advised patient of results and Dr Teresa Pelton message regarding pellet implant changes and possible other options. Patient verbalized understanding.

## 2018-08-22 NOTE — Telephone Encounter (Signed)
-----   Message from Scott C Stoioff, MD sent at 08/22/2018  8:29 AM EST ----- Testosterone level was 490.  Hematocrit slightly elevated at 51.9.  Please let him know that due to insurance changes will only be able to implant 6 pellets every 3 months.  This is significantly lower than what he was previously getting.  We can look at other options including starting on testosterone injections. 

## 2018-08-22 NOTE — Progress Notes (Signed)
SUBJECTIVE:  Patient ID: Collin Butler, male    DOB: July 14, 1985, 34 y.o.   MRN: [id number] Chief Complaint  Patient presents with  . Follow-up    post laser    HPI  [name] is a 33 y.o. male The patient returns to the office for followup status post laser ablation of the right great saphenous vein on 07/16/2018, and the left on 07/29/2018.  The patient denies any pain from residual varicosities bilaterally.  The patient states that his bilateral lower extremity wounds have since healed.  He states that the bruising and swelling following the procedure has completely resolved.  He states that he continues to have some soreness and aching in his bilateral legs when standing and doing long activities.  He endorses continuing to wear compression stockings as well as elevating his lower extremities as much as possible.  He denies any fever, chills, nausea, vomiting or diarrhea.  The patient is otherwise done well and there have been no complications related to the laser procedure or interval changes in the patient's overall   Venous ultrasound post laser shows successful laser ablation of the left and right great saphenous veins, no DVT identified.  Past Medical History:  Diagnosis Date  . Asthma   . Hypogonadism in male   . Klinefelter syndrome   . Peripheral vascular disease (HCC)   . Sleep apnea     Past Surgical History:  Procedure Laterality Date  . NO PAST SURGERIES      Social History   Socioeconomic History  . Marital status: Married    Spouse name: Not on file  . Number of children: Not on file  . Years of education: Not on file  . Highest education level: Not on file  Occupational History  . Not on file  Social Needs  . Financial resource strain: Not on file  . Food insecurity:    Worry: Not on file    Inability: Not on file  . Transportation needs:    Medical: Not on file    Non-medical: Not on file  Tobacco Use  . Smoking status: Never Smoker  .  Smokeless tobacco: Never Used  Substance and Sexual Activity  . Alcohol use: Yes    Comment: rare  . Drug use: No  . Sexual activity: Yes  Lifestyle  . Physical activity:    Days per week: Not on file    Minutes per session: Not on file  . Stress: Not on file  Relationships  . Social connections:    Talks on phone: Not on file    Gets together: Not on file    Attends religious service: Not on file    Active member of club or organization: Not on file    Attends meetings of clubs or organizations: Not on file    Relationship status: Not on file  . Intimate partner violence:    Fear of current or ex partner: Not on file    Emotionally abused: Not on file    Physically abused: Not on file    Forced sexual activity: Not on file  Other Topics Concern  . Not on file  Social History Narrative  . Not on file    Family History  Problem Relation Age of Onset  . Diabetes type I Sister   . Cancer Maternal Grandmother   . Hypertension Maternal Grandfather   . Prostate cancer Neg Hx   . Bladder Cancer Neg Hx   . Kidney cancer Neg  Hx     No Known Allergies   Review of Systems   Review of Systems: Negative Unless Checked Constitutional: [] Weight loss  [] Fever  [] Chills Cardiac: [] Chest pain   []  Atrial Fibrillation  [] Palpitations   [] Shortness of breath when laying flat   [] Shortness of breath with exertion. [] Shortness of breath at rest Vascular:  [] Pain in legs with walking   [] Pain in legs with standing [] Pain in legs when laying flat   [] Claudication    [] Pain in feet when laying flat    [] History of DVT   [] Phlebitis   [] Swelling in legs   [x] Varicose veins   [] Non-healing ulcers Pulmonary:   [] Uses home oxygen   [] Productive cough   [] Hemoptysis   [] Wheeze  [] COPD   [x] Asthma Neurologic:  [] Dizziness   [] Seizures  [] Blackouts [] History of stroke   [] History of TIA  [] Aphasia   [] Temporary Blindness   [] Weakness or numbness in arm   [] Weakness or numbness in  leg Musculoskeletal:   [] Joint swelling   [] Joint pain   [] Low back pain  []  History of Knee Replacement [] Arthritis [] back Surgeries  []  Spinal Stenosis    Hematologic:  [] Easy bruising  [] Easy bleeding   [] Hypercoagulable state   [] Anemic Gastrointestinal:  [] Diarrhea   [] Vomiting  [] Gastroesophageal reflux/heartburn   [] Difficulty swallowing. [] Abdominal pain Genitourinary:  [] Chronic kidney disease   [] Difficult urination  [] Anuric   [] Blood in urine [] Frequent urination  [] Burning with urination   [] Hematuria Skin:  [] Rashes   [] Ulcers [] Wounds Psychological:  [] History of anxiety   []  History of major depression  []  Memory Difficulties      OBJECTIVE:   Physical Exam  BP 121/80 (BP Location: Right Arm)   Pulse 69   Resp 16   Ht 6\' 8"  (2.032 m)   Wt (!) 353 lb 12.8 oz (160.5 kg)   BMI 38.87 kg/m   Gen: WD/WN, NAD Head: [place]/AT, No temporalis wasting.  Ear/Nose/Throat: Hearing grossly intact, nares w/o erythema or drainage Eyes: PER, EOMI, sclera nonicteric.  Neck: Supple, no masses.  No JVD.  Pulmonary:  Good air movement, no use of accessory muscles.  Cardiac: RRR Vascular:  Vessel Right Left  Radial Palpable Palpable  Dorsalis Pedis Palpable Palpable  Posterior Tibial Palpable Palpable   Gastrointestinal: soft, non-distended. No guarding/no peritoneal signs.  Musculoskeletal: M/S 5/5 throughout.  No deformity or atrophy.  Neurologic: Pain and light touch intact in extremities.  Symmetrical.  Speech is fluent. Motor exam as listed above. Psychiatric: Judgment intact, Mood & affect appropriate for pt's clinical situation. Dermatologic: Healed Venous Stasis ulcers on bilateral legs  No changes consistent with cellulitis. Lymph : No Cervical lymphadenopathy, no lichenification or skin changes of chronic lymphedema.       ASSESSMENT AND PLAN:  1. 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  The patient does not wish to proceed with sclerotherapy at this time.  We will have the patient follow-up in 6 months to reevaluate whether he will need sclerotherapy or if there are any further development of ulcerations or lower extremity swelling.  2. Mild intermittent asthma with acute exacerbation Continue pulmonary medications and aerosols as already ordered, these medications have been reviewed and there are no changes at this time.   3. Swelling of limb Swelling not present at this time.  Per the patient he has not had any swelling of the lower extremity  since his bilateral endovenous laser ablation.   Current Outpatient Medications on File Prior to Visit  Medication Sig Dispense Refill  . ibuprofen (ADVIL,MOTRIN) 200 MG tablet Take 200 mg by mouth.    . levocetirizine (XYZAL) 5 MG tablet Take 5 mg by mouth daily.    . Testosterone 75 MG PLLT Inject 1,200 mg into the skin.     No current facility-administered medications on file prior to visit.     There are no Patient Instructions on file for this visit. No follow-ups on file.   Georgiana SpinnerFallon E Damonta Cossey, NP  This note was completed with Office managerDragon Dictation.  Any errors are purely unintentional.

## 2018-08-22 NOTE — Telephone Encounter (Signed)
-----   Message from Riki Altes, MD sent at 08/22/2018  8:29 AM EST ----- Testosterone level was 490.  Hematocrit slightly elevated at 51.9.  Please let him know that due to insurance changes will only be able to implant 6 pellets every 3 months.  This is significantly lower than what he was previously getting.  We can look at other options including starting on testosterone injections.

## 2018-08-23 ENCOUNTER — Ambulatory Visit (INDEPENDENT_AMBULATORY_CARE_PROVIDER_SITE_OTHER): Payer: Managed Care, Other (non HMO) | Admitting: Nurse Practitioner

## 2018-09-18 ENCOUNTER — Telehealth: Payer: Self-pay | Admitting: Urology

## 2018-09-18 MED ORDER — TESTOSTERONE ENANTHATE 100 MG/0.5ML ~~LOC~~ SOAJ: 100.0000 mg | SUBCUTANEOUS | 1 refills | Status: DC

## 2018-09-18 NOTE — Telephone Encounter (Signed)
Patient does not want to continue with the testopel he would like to start back on the injections. Doe she need an app to discuss or can he just have a script and a nurse visit to start these?   Collin Butler

## 2018-09-18 NOTE — Telephone Encounter (Signed)
My chart message was sent to the patient letting him know this    Marcelino Duster

## 2018-09-18 NOTE — Telephone Encounter (Signed)
He was interested in starting [place].  I sent in an Rx to [facility] pharmacy to see if this can be authorized.

## 2018-09-20 ENCOUNTER — Ambulatory Visit: Payer: Managed Care, Other (non HMO) | Admitting: Urology

## 2018-09-24 ENCOUNTER — Telehealth: Payer: Self-pay | Admitting: Urology

## 2018-09-24 NOTE — Telephone Encounter (Signed)
Pt would like a call back to teach him how to administer Xyosted.

## 2018-09-25 NOTE — Telephone Encounter (Signed)
Can add him to my 3/31 schedule

## 2018-09-25 NOTE — Telephone Encounter (Signed)
Patient notified and appointment made

## 2018-10-01 ENCOUNTER — Other Ambulatory Visit: Payer: Self-pay

## 2018-10-01 ENCOUNTER — Ambulatory Visit: Payer: Managed Care, Other (non HMO) | Admitting: Urology

## 2018-10-01 ENCOUNTER — Encounter: Payer: Self-pay | Admitting: Urology

## 2018-10-01 VITALS — BP 137/87 | HR 94 | Ht >= 80 in | Wt 350.7 lb

## 2018-10-01 DIAGNOSIS — E291 Testicular hypofunction: Secondary | ICD-10-CM | POA: Diagnosis not present

## 2018-10-02 ENCOUNTER — Encounter: Payer: Self-pay | Admitting: Urology

## 2018-10-02 NOTE — Progress Notes (Signed)
10/01/2018 10:16 AM   Collin Butler 02/19/1986 [id number]  Referring provider: Particia Nearing, PA-C [address] [place], [place] [address]  Chief Complaint  Patient presents with  . Hypogonadism    HPI: 33 year old male with Klinefelter syndrome who was on Testopel for TRT.  He received 18 pellets every 4 months however insurance will now only cover 6 pellets every 3 months.  He elected a trial of Xyosted.  He want to come in today for injection training.  Insurance has approved this medication which he brought in today.   PMH: Past Medical History:  Diagnosis Date  . Asthma   . Hypogonadism in male   . Klinefelter syndrome   . Peripheral vascular disease (HCC)   . Sleep apnea     Surgical History: Past Surgical History:  Procedure Laterality Date  . NO PAST SURGERIES      Home Medications:  Allergies as of 10/01/2018   No Known Allergies     Medication List       Accurate as of October 01, 2018 11:59 PM. Always use your most recent med list.        ibuprofen 200 MG tablet Commonly known as:  ADVIL,MOTRIN Take 200 mg by mouth.   levocetirizine 5 MG tablet Commonly known as:  XYZAL Take 5 mg by mouth daily.   Testosterone Enanthate 100 MG/0.5ML Soaj Commonly known as:  Xyosted Inject 100 mg into the skin once a week.       Allergies: No Known Allergies  Family History: Family History  Problem Relation Age of Onset  . Diabetes type I Sister   . Cancer Maternal Grandmother   . Hypertension Maternal Grandfather   . Prostate cancer Neg Hx   . Bladder Cancer Neg Hx   . Kidney cancer Neg Hx     Social History:  reports that he has never smoked. He has never used smokeless tobacco. He reports current alcohol use. He reports that he does not use drugs.  ROS: UROLOGY Frequent Urination?: No Hard to postpone urination?: No Burning/pain with urination?: No Get up at night to urinate?: No Leakage of urine?: No Urine stream starts and stops?: No  Trouble starting stream?: No Do you have to strain to urinate?: No Blood in urine?: No Urinary tract infection?: No Sexually transmitted disease?: No Injury to kidneys or bladder?: No Painful intercourse?: No Weak stream?: No Erection problems?: Yes Penile pain?: No  Gastrointestinal Nausea?: No Vomiting?: No Indigestion/heartburn?: No Diarrhea?: No Constipation?: No  Constitutional Fever: No Night sweats?: No Weight loss?: No Fatigue?: Yes  Skin Skin rash/lesions?: No Itching?: No  Eyes Blurred vision?: No Double vision?: No  Ears/Nose/Throat Sore throat?: No Sinus problems?: No  Hematologic/Lymphatic Swollen glands?: No Easy bruising?: No  Cardiovascular Leg swelling?: No Chest pain?: No  Respiratory Cough?: No Shortness of breath?: No  Endocrine Excessive thirst?: No  Musculoskeletal Back pain?: No Joint pain?: No  Neurological Headaches?: No Dizziness?: No  Psychologic Depression?: No Anxiety?: No  Physical Exam: BP 137/87 (BP Location: Left Arm, Patient Position: Sitting, Cuff Size: Large)   Pulse 94   Ht 6\' 8"  (2.032 m)   Wt (!) 350 lb 11.2 oz (159.1 kg)   BMI 38.53 kg/m   Constitutional:  Alert and oriented, No acute distress.   Assessment & Plan:   I demonstrated the injection technique with the [facility] trainer which he also used.  He was then observed to successfully inject the [facility] in the subcutaneous tissue  of the right lower abdomen.  He will continue weekly injections.  I recommended a testosterone level in 4 weeks and follow-up visit in 3 months.  Riki Altes, MD  [facility] [address], [address] [place], [place] [address] [phone]

## 2018-10-28 ENCOUNTER — Telehealth: Payer: Self-pay | Admitting: Urology

## 2018-10-28 ENCOUNTER — Other Ambulatory Visit: Payer: Managed Care, Other (non HMO)

## 2018-10-28 ENCOUNTER — Other Ambulatory Visit: Payer: Self-pay

## 2018-10-28 DIAGNOSIS — E291 Testicular hypofunction: Secondary | ICD-10-CM

## 2018-10-28 NOTE — Telephone Encounter (Signed)
Pt's labs faxed to Attn: Vernona Rieger at [facility]

## 2018-10-28 NOTE — Telephone Encounter (Signed)
ERROR

## 2018-10-28 NOTE — Telephone Encounter (Signed)
Vernona Rieger called from [facility] and needs a call back with pt's low T scores for PA for testosterone. Please advise

## 2018-10-29 ENCOUNTER — Telehealth: Payer: Self-pay

## 2018-10-29 LAB — TESTOSTERONE: Testosterone: 390 ng/dL (ref 264–916)

## 2018-10-29 NOTE — Telephone Encounter (Signed)
-----   Message from Riki Altes, MD sent at 10/29/2018  9:44 AM EDT ----- Testosterone level was 390.  Have are his symptoms on Xyosted?

## 2018-10-29 NOTE — Telephone Encounter (Signed)
Called pt informed him of the information below. Pt denies any symptoms or side effects at this time.

## 2018-10-30 ENCOUNTER — Telehealth: Payer: Self-pay

## 2018-10-30 NOTE — Telephone Encounter (Signed)
Incoming prior auth approval from [facility]  Case Number: [id number]  [facility] 100mg /0.63ml  Approval is good fro 12 months

## 2018-12-12 ENCOUNTER — Ambulatory Visit: Payer: Managed Care, Other (non HMO) | Admitting: Urology

## 2018-12-12 ENCOUNTER — Other Ambulatory Visit: Payer: Self-pay

## 2018-12-12 ENCOUNTER — Encounter: Payer: Self-pay | Admitting: Urology

## 2018-12-12 VITALS — BP 126/84 | HR 76 | Ht >= 80 in | Wt 349.0 lb

## 2018-12-12 DIAGNOSIS — E291 Testicular hypofunction: Secondary | ICD-10-CM

## 2018-12-12 DIAGNOSIS — Q984 Klinefelter syndrome, unspecified: Secondary | ICD-10-CM | POA: Diagnosis not present

## 2018-12-12 NOTE — Progress Notes (Signed)
12/12/2018 2:12 PM   Collin LaniusJoshua Porada 02/07/1986 [id number]  Referring provider: Particia NearingLane, Rachel Elizabeth, PA-C [address] [place],  [place] [id number]  Chief Complaint  Patient presents with  . Hypogonadism    HPI: 33 year old male with Klinefelter's and hypogonadism.  He was previously on Testopel however due to number of pellets required and insurance coverage he recently switched to Xyosted 100 mg weekly.  He sees symptomatic improvement comparable to Testopel with  good energy level and libido.  Denies breast tenderness/enlargement, lower extremity edema or voiding problems.  Last injection was 10 days ago.  PMH: Past Medical History:  Diagnosis Date  . Asthma   . Hypogonadism in male   . Klinefelter syndrome   . Peripheral vascular disease (HCC)   . Sleep apnea     Surgical History: Past Surgical History:  Procedure Laterality Date  . NO PAST SURGERIES      Home Medications:  Allergies as of 12/12/2018   No Known Allergies     Medication List       Accurate as of December 12, 2018  2:12 PM. If you have any questions, ask your nurse or doctor.        ibuprofen 200 MG tablet Commonly known as: ADVIL Take 200 mg by mouth.   levocetirizine 5 MG tablet Commonly known as: XYZAL Take 5 mg by mouth daily.   Testosterone Enanthate 100 MG/0.5ML Soaj Commonly known as: Xyosted Inject 100 mg into the skin once a week.       Allergies: No Known Allergies  Family History: Family History  Problem Relation Age of Onset  . Diabetes type I Sister   . Cancer Maternal Grandmother   . Hypertension Maternal Grandfather   . Prostate cancer Neg Hx   . Bladder Cancer Neg Hx   . Kidney cancer Neg Hx     Social History:  reports that he has never smoked. He has never used smokeless tobacco. He reports current alcohol use. He reports that he does not use drugs.  ROS: UROLOGY Frequent Urination?: No Hard to postpone urination?: No Burning/pain with urination?: No Get  up at night to urinate?: No Leakage of urine?: No Urine stream starts and stops?: No Trouble starting stream?: No Do you have to strain to urinate?: No Blood in urine?: No Urinary tract infection?: No Sexually transmitted disease?: No Injury to kidneys or bladder?: No Painful intercourse?: No Weak stream?: No Erection problems?: No Penile pain?: No  Gastrointestinal Nausea?: No Vomiting?: No Indigestion/heartburn?: No Diarrhea?: No Constipation?: No  Constitutional Fever: No Night sweats?: No Weight loss?: No Fatigue?: No  Skin Skin rash/lesions?: No Itching?: Yes  Eyes Blurred vision?: No Double vision?: No  Ears/Nose/Throat Sore throat?: No Sinus problems?: No  Hematologic/Lymphatic Swollen glands?: No Easy bruising?: No  Cardiovascular Leg swelling?: No Chest pain?: No  Respiratory Cough?: No Shortness of breath?: No  Endocrine Excessive thirst?: No  Musculoskeletal Back pain?: No Joint pain?: No  Neurological Headaches?: No Dizziness?: No  Psychologic Depression?: No Anxiety?: No  Physical Exam: BP 126/84 (BP Location: Left Arm, Patient Position: Sitting, Cuff Size: Large)   Pulse 76   Ht 6\' 8"  (2.032 m)   Wt (!) 349 lb (158.3 kg)   BMI 38.34 kg/m   Constitutional:  Alert and oriented, No acute distress. HEENT: [place] AT, moist mucus membranes.  Trachea midline, no masses. Cardiovascular: No clubbing, cyanosis, or edema. Respiratory: Normal respiratory effort, no increased work of breathing. Skin: No rashes, bruises or  suspicious lesions. Neurologic: Grossly intact, no focal deficits, moving all 4 extremities. Psychiatric: Normal mood and affect.   Assessment & Plan:   33 year old male with Klinefelter syndrome on TRT.  Currently doing well on Xyosted.  Testosterone level was drawn today.  Unless there are significant abnormalities he will follow-up in 6 months for testosterone, hematocrit and PSA.   Abbie Sons, [place] [address] [place], [place] [address] [phone]

## 2018-12-13 ENCOUNTER — Telehealth: Payer: Self-pay | Admitting: Family Medicine

## 2018-12-13 ENCOUNTER — Other Ambulatory Visit: Payer: Self-pay | Admitting: Urology

## 2018-12-13 LAB — TESTOSTERONE: Testosterone: 282 ng/dL (ref 264–916)

## 2018-12-13 MED ORDER — XYOSTED 100 MG/0.5ML ~~LOC~~ SOAJ: 100.0000 mg | SUBCUTANEOUS | 1 refills | Status: DC

## 2018-12-13 NOTE — Telephone Encounter (Signed)
LMOM for patient to return call.

## 2018-12-13 NOTE — Telephone Encounter (Signed)
-----   Message from Scott C Stoioff, MD sent at 12/13/2018  9:08 AM EDT ----- Testosterone level was low normal at 282 however he was 3 days overdue for an injection. 

## 2018-12-13 NOTE — Telephone Encounter (Signed)
-----   Message from Abbie Sons, MD sent at 12/13/2018  9:08 AM EDT ----- Testosterone level was low normal at 282 however he was 3 days overdue for an injection.

## 2018-12-16 NOTE — Telephone Encounter (Signed)
mychart notification sent 

## 2018-12-31 ENCOUNTER — Ambulatory Visit: Payer: Managed Care, Other (non HMO) | Admitting: Urology

## 2019-02-21 ENCOUNTER — Ambulatory Visit (INDEPENDENT_AMBULATORY_CARE_PROVIDER_SITE_OTHER): Payer: Managed Care, Other (non HMO) | Admitting: Nurse Practitioner

## 2019-02-24 ENCOUNTER — Telehealth: Payer: Self-pay | Admitting: Urology

## 2019-02-24 NOTE — Telephone Encounter (Signed)
Pt called and updated insurance info and states that he needs PA for his [place]. Please advise.

## 2019-02-25 NOTE — Telephone Encounter (Signed)
The most up to date insurance information was used when submitting prior auth (see denial letter in media) Sent pt mychart message informing him of this.

## 2019-02-26 NOTE — Telephone Encounter (Signed)
Ginger w/[employer] called about a PA for [employer].  She said they sent over a denial and are waiting for more information.  Please call her at [phone] (Reference# [id number])

## 2019-03-13 NOTE — Progress Notes (Unsigned)
Patient's insurance, Christella Scheuermann, has denied [place].  We are working on an appeal but it is not looking good.  Patient has tried and cannot tolerate the patch or the gel.    The insurance company has the following drugs on his formulary list:   Testosterone cypionate Depotestosterone Enanthate Delatestryl  He states that he has used the cypionate injections in the past and seemed to work well.  He is asking for a new prescription.  What do you recommend?

## 2019-03-14 ENCOUNTER — Other Ambulatory Visit: Payer: Self-pay | Admitting: Urology

## 2019-03-14 MED ORDER — TESTOSTERONE CYPIONATE 200 MG/ML IM SOLN: 200.0000 mg | INTRAMUSCULAR | 0 refills | Status: DC

## 2019-03-14 MED ORDER — "LUER LOCK SAFETY SYRINGES 21G X 1-1/2"" 3 ML MISC": 0 refills | Status: AC

## 2019-03-14 MED ORDER — "NEEDLE (DISP) 18G X 1"" MISC": 0 refills | Status: AC

## 2019-03-18 ENCOUNTER — Telehealth: Payer: Self-pay | Admitting: Urology

## 2019-03-18 NOTE — Telephone Encounter (Signed)
Cover my meds LMOM and states that pt needs PA for [facility].  Ref# [place] Ph# [phone]

## 2019-03-19 NOTE — Telephone Encounter (Signed)
Pt called and states that his needles anmd syringes need PA for him to inject his Testosterone.

## 2019-03-20 ENCOUNTER — Encounter (INDEPENDENT_AMBULATORY_CARE_PROVIDER_SITE_OTHER): Payer: Self-pay | Admitting: Nurse Practitioner

## 2019-03-20 ENCOUNTER — Other Ambulatory Visit: Payer: Self-pay

## 2019-03-20 ENCOUNTER — Ambulatory Visit (INDEPENDENT_AMBULATORY_CARE_PROVIDER_SITE_OTHER): Payer: Managed Care, Other (non HMO) | Admitting: Nurse Practitioner

## 2019-03-20 VITALS — BP 136/86 | HR 76 | Resp 10 | Ht >= 80 in | Wt 348.0 lb

## 2019-03-20 DIAGNOSIS — I83013 Varicose veins of right lower extremity with ulcer of ankle: Secondary | ICD-10-CM | POA: Diagnosis not present

## 2019-03-20 DIAGNOSIS — J4521 Mild intermittent asthma with (acute) exacerbation: Secondary | ICD-10-CM | POA: Diagnosis not present

## 2019-03-20 DIAGNOSIS — L819 Disorder of pigmentation, unspecified: Secondary | ICD-10-CM

## 2019-03-20 DIAGNOSIS — L97311 Non-pressure chronic ulcer of right ankle limited to breakdown of skin: Secondary | ICD-10-CM

## 2019-03-20 NOTE — Progress Notes (Signed)
SUBJECTIVE:  Patient ID: Collin Butler, male    DOB: Mar 14, 1986, 33 y.o.   MRN: [id number] Chief Complaint  Patient presents with  . Follow-up    HPI  Collin Butler is a 33 y.o. male times following up today to determine patient's progress with varicose veins.  The patient previously underwent bilateral endovenous laser ablations and January.  The patient had numerous ulcerations on his bilateral lower extremities.  Today those ulcerations are completely healed however there is some darkening where the skin healed.  He denies any further soreness or aching in his legs when doing activities.  He continues to wear medical grade 1 compression stockings which help with any residual edema.  Otherwise the patient endorses doing well.  He has been doing well with conservative therapy.  He denies any fever, chills, nausea, vomiting or diarrhea.  Past Medical History:  Diagnosis Date  . Asthma   . Hypogonadism in male   . Klinefelter syndrome   . Peripheral vascular disease ([place])   . Sleep apnea     Past Surgical History:  Procedure Laterality Date  . NO PAST SURGERIES      Social History   Socioeconomic History  . Marital status: Married    Spouse name: Not on file  . Number of children: Not on file  . Years of education: Not on file  . Highest education level: Not on file  Occupational History  . Not on file  Social Needs  . Financial resource strain: Not on file  . Food insecurity    Worry: Not on file    Inability: Not on file  . Transportation needs    Medical: Not on file    Non-medical: Not on file  Tobacco Use  . Smoking status: Never Smoker  . Smokeless tobacco: Never Used  Substance and Sexual Activity  . Alcohol use: Yes    Comment: rare  . Drug use: No  . Sexual activity: Yes    Birth control/protection: None  Lifestyle  . Physical activity    Days per week: Not on file    Minutes per session: Not on file  . Stress: Not on file  Relationships  . Social  Herbalist on phone: Not on file    Gets together: Not on file    Attends religious service: Not on file    Active member of club or organization: Not on file    Attends meetings of clubs or organizations: Not on file    Relationship status: Not on file  . Intimate partner violence    Fear of current or ex partner: Not on file    Emotionally abused: Not on file    Physically abused: Not on file    Forced sexual activity: Not on file  Other Topics Concern  . Not on file  Social History Narrative  . Not on file    Family History  Problem Relation Age of Onset  . Diabetes type I Sister   . Cancer Maternal Grandmother   . Hypertension Maternal Grandfather   . Prostate cancer Neg Hx   . Bladder Cancer Neg Hx   . Kidney cancer Neg Hx     No Known Allergies   Review of Systems   Review of Systems: Negative Unless Checked Constitutional: [] Weight loss  [] Fever  [] Chills Cardiac: [] Chest pain   []  Atrial Fibrillation  [] Palpitations   [] Shortness of breath when laying flat   [] Shortness of breath with exertion. []   Shortness of breath at rest Vascular:  [] Pain in legs with walking   [] Pain in legs with standing [] Pain in legs when laying flat   [] Claudication    [] Pain in feet when laying flat    [] History of DVT   [] Phlebitis   [] Swelling in legs   [x] Varicose veins   [] Non-healing ulcers Pulmonary:   [] Uses home oxygen   [] Productive cough   [] Hemoptysis   [] Wheeze  [] COPD   [x] Asthma Neurologic:  [] Dizziness   [] Seizures  [] Blackouts [] History of stroke   [] History of TIA  [] Aphasia   [] Temporary Blindness   [] Weakness or numbness in arm   [] Weakness or numbness in leg Musculoskeletal:   [] Joint swelling   [] Joint pain   [] Low back pain  []  History of Knee Replacement [] Arthritis [] back Surgeries  []  Spinal Stenosis    Hematologic:  [] Easy bruising  [] Easy bleeding   [] Hypercoagulable state   [] Anemic Gastrointestinal:  [] Diarrhea   [] Vomiting  [] Gastroesophageal  reflux/heartburn   [] Difficulty swallowing. [] Abdominal pain Genitourinary:  [] Chronic kidney disease   [] Difficult urination  [] Anuric   [] Blood in urine [] Frequent urination  [] Burning with urination   [] Hematuria Skin:  [] Rashes   [] Ulcers [] Wounds Psychological:  [] History of anxiety   []  History of major depression  []  Memory Difficulties      OBJECTIVE:   Physical Exam  BP 136/86 (BP Location: Left Arm, Patient Position: Sitting, Cuff Size: Normal)   Pulse 76   Resp 10   Ht 6\' 8"  (2.032 m)   Wt (!) 348 lb (157.9 kg)   BMI 38.23 kg/m   Gen: WD/WN, NAD Head: [place]/AT, No temporalis wasting.  Ear/Nose/Throat: Hearing grossly intact, nares w/o erythema or drainage Eyes: PER, EOMI, sclera nonicteric.  Neck: Supple, no masses.  No JVD.  Pulmonary:  Good air movement, no use of accessory muscles.  Cardiac: RRR Vascular:  Vessel Right Left  Radial Palpable Palpable  Dorsalis Pedis Palpable Palpable  Posterior Tibial Palpable Palpable   Gastrointestinal: soft, non-distended. No guarding/no peritoneal signs.  Musculoskeletal: M/S 5/5 throughout.  No deformity or atrophy.  Neurologic: Pain and light touch intact in extremities.  Symmetrical.  Speech is fluent. Motor exam as listed above. Psychiatric: Judgment intact, Mood & affect appropriate for pt's clinical situation. Dermatologic: Dark discoloration after healed venous ulcer on bilateral medial ankles Lymph : No Cervical lymphadenopathy, no lichenification or skin changes of chronic lymphedema.       ASSESSMENT AND PLAN:  1. Discoloration of skin of lower leg The patient has several areas of discoloration on his lower extremities due to previous ulcerations that have healed.  The patient was wanting to know about remedies in order to help the light in this area.  Due to the sensitive nature of the skin as well as the thinness of the area, I will refer to dermatology for appropriate medical therapies that may be useful in  treating this discoloration. - Ambulatory referral to Dermatology  2. 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  At this time the patient does not wish to undergo any further intervention.  He will contact our office on an as-needed basis.  3. Mild intermittent asthma with acute exacerbation Continue NSAID medications as already ordered, these medications have been reviewed and there are no changes at this time.  Continued activity and therapy  was stressed.    Current Outpatient Medications on File Prior to Visit  Medication Sig Dispense Refill  . ibuprofen (ADVIL,MOTRIN) 200 MG tablet Take 200 mg by mouth.    . levocetirizine (XYZAL) 5 MG tablet Take 5 mg by mouth every evening.    . testosterone cypionate (DEPOTESTOSTERONE CYPIONATE) 200 MG/ML injection Inject 1 mL (200 mg total) into the muscle every 14 (fourteen) days. 10 mL 0  . NEEDLE, DISP, 18 G 18G X 1" MISC Use as directed 50 each 0  . SYRINGE-NEEDLE, DISP, 3 ML (LUER LOCK SAFETY SYRINGES) 21G X 1-1/2" 3 ML MISC Use as directed for testosterone administration 50 each 0   No current facility-administered medications on file prior to visit.     There are no Patient Instructions on file for this visit. No follow-ups on file.   Georgiana SpinnerFallon E Alixandria Friedt, NP  This note was completed with Office managerDragon Dictation.  Any errors are purely unintentional.

## 2019-03-20 NOTE — Telephone Encounter (Signed)
See my chart message

## 2019-03-27 ENCOUNTER — Telehealth: Payer: Self-pay

## 2019-03-27 NOTE — Telephone Encounter (Signed)
Incoming fax from [place] giving approval of Xyosted 100mg /0.28ml for 1 year, effective 03/11/2019. Pt informed via mychart.  Reference #[id number]

## 2019-03-31 ENCOUNTER — Encounter: Payer: Self-pay | Admitting: Family Medicine

## 2019-03-31 ENCOUNTER — Ambulatory Visit (INDEPENDENT_AMBULATORY_CARE_PROVIDER_SITE_OTHER): Payer: Managed Care, Other (non HMO) | Admitting: Family Medicine

## 2019-03-31 ENCOUNTER — Other Ambulatory Visit: Payer: Self-pay

## 2019-03-31 VITALS — BP 121/79 | HR 65 | Temp 97.4°F | Ht >= 80 in | Wt 350.2 lb

## 2019-03-31 DIAGNOSIS — R1909 Other intra-abdominal and pelvic swelling, mass and lump: Secondary | ICD-10-CM

## 2019-03-31 DIAGNOSIS — R21 Rash and other nonspecific skin eruption: Secondary | ICD-10-CM

## 2019-03-31 DIAGNOSIS — R7989 Other specified abnormal findings of blood chemistry: Secondary | ICD-10-CM

## 2019-03-31 DIAGNOSIS — R945 Abnormal results of liver function studies: Secondary | ICD-10-CM

## 2019-03-31 DIAGNOSIS — Z114 Encounter for screening for human immunodeficiency virus [HIV]: Secondary | ICD-10-CM | POA: Diagnosis not present

## 2019-03-31 DIAGNOSIS — Z0000 Encounter for general adult medical examination without abnormal findings: Secondary | ICD-10-CM | POA: Diagnosis not present

## 2019-03-31 LAB — UA/M W/RFLX CULTURE, ROUTINE
Bilirubin, UA: NEGATIVE
Glucose, UA: NEGATIVE
Ketones, UA: NEGATIVE
Leukocytes,UA: NEGATIVE
Nitrite, UA: NEGATIVE
Protein,UA: NEGATIVE
RBC, UA: NEGATIVE
Specific Gravity, UA: 1.025 (ref 1.005–1.030)
Urobilinogen, Ur: 0.2 mg/dL (ref 0.2–1.0)
pH, UA: 5 (ref 5.0–7.5)

## 2019-03-31 MED ORDER — CLOBETASOL PROPIONATE 0.05 % EX OINT: 1.0000 "application " | TOPICAL_OINTMENT | Freq: Two times a day (BID) | CUTANEOUS | 2 refills | Status: DC | PRN

## 2019-03-31 NOTE — Progress Notes (Signed)
BP 121/79 (BP Location: Right Arm, Patient Position: Sitting, Cuff Size: Normal)   Pulse 65   Temp (!) 97.4 F (36.3 C) (Oral)   Ht 6\' 8"  (2.032 m)   Wt (!) 350 lb 3.2 oz (158.8 kg)   SpO2 95%   BMI 38.47 kg/m    Subjective:    Patient ID: Collin Butler, male    DOB: February 12, 1986, 33 y.o.   MRN: [id number]  HPI: Collin Butler is a 33 y.o. male presenting on 03/31/2019 for comprehensive medical examination. Current medical complaints include:see below  Spot near groin that he first noticed about 6 months ago. Has not grown in size, just firmer than when first noticed. No discoloration, pain, itching, injury to area. Has not tried anything OTC.   Having some plaques appear on several knuckles that have appeared over the past few months. Not trying anything at home for them. Not itchy or painful, no new products or diet changes.   He currently lives with: Interim Problems from his last visit: no  Depression Screen done today and results listed below:  Depression screen [facility] 2/9 03/31/2019 03/27/2018 07/12/2017 04/26/2017  Decreased Interest 0 0 0 0  Down, Depressed, Hopeless 0 0 0 0  PHQ - 2 Score 0 0 0 0  Altered sleeping 1 1 - -  Tired, decreased energy 2 1 - -  Change in appetite 0 0 - -  Feeling bad or failure about yourself  1 0 - -  Trouble concentrating 0 0 - -  Moving slowly or fidgety/restless 0 0 - -  Suicidal thoughts 0 0 - -  PHQ-9 Score 4 2 - -    The patient does not have a history of falls. I did complete a risk assessment for falls. A plan of care for falls was documented.   Past Medical History:  Past Medical History:  Diagnosis Date  . Asthma   . Hypogonadism in male   . Klinefelter syndrome   . Peripheral vascular disease ([place])   . Sleep apnea     Surgical History:  Past Surgical History:  Procedure Laterality Date  . NO PAST SURGERIES      Medications:  Current Outpatient Medications on File Prior to Visit  Medication Sig  . ibuprofen  (ADVIL,MOTRIN) 200 MG tablet Take 200 mg by mouth.  . levocetirizine (XYZAL) 5 MG tablet Take 5 mg by mouth every evening.  Marland Kitchen NEEDLE, DISP, 18 G 18G X 1" MISC Use as directed  . SYRINGE-NEEDLE, DISP, 3 ML (LUER LOCK SAFETY SYRINGES) 21G X 1-1/2" 3 ML MISC Use as directed for testosterone administration  . testosterone cypionate (DEPOTESTOSTERONE CYPIONATE) 200 MG/ML injection Inject 1 mL (200 mg total) into the muscle every 14 (fourteen) days.   No current facility-administered medications on file prior to visit.     Allergies:  No Known Allergies  Social History:  Social History   Socioeconomic History  . Marital status: Married    Spouse name: Not on file  . Number of children: Not on file  . Years of education: Not on file  . Highest education level: Not on file  Occupational History  . Not on file  Social Needs  . Financial resource strain: Not on file  . Food insecurity    Worry: Not on file    Inability: Not on file  . Transportation needs    Medical: Not on file    Non-medical: Not on file  Tobacco Use  . Smoking  status: Never Smoker  . Smokeless tobacco: Never Used  Substance and Sexual Activity  . Alcohol use: Yes    Comment: rare  . Drug use: No  . Sexual activity: Yes    Birth control/protection: None  Lifestyle  . Physical activity    Days per week: Not on file    Minutes per session: Not on file  . Stress: Not on file  Relationships  . Social Musicianconnections    Talks on phone: Not on file    Gets together: Not on file    Attends religious service: Not on file    Active member of club or organization: Not on file    Attends meetings of clubs or organizations: Not on file    Relationship status: Not on file  . Intimate partner violence    Fear of current or ex partner: Not on file    Emotionally abused: Not on file    Physically abused: Not on file    Forced sexual activity: Not on file  Other Topics Concern  . Not on file  Social History Narrative   . Not on file   Social History   Tobacco Use  Smoking Status Never Smoker  Smokeless Tobacco Never Used   Social History   Substance and Sexual Activity  Alcohol Use Yes   Comment: rare    Family History:  Family History  Problem Relation Age of Onset  . Diabetes type I Sister   . Cancer Maternal Grandmother   . Hypertension Maternal Grandfather   . Prostate cancer Neg Hx   . Bladder Cancer Neg Hx   . Kidney cancer Neg Hx     Past medical history, surgical history, medications, allergies, family history and social history reviewed with patient today and changes made to appropriate areas of the chart.   Review of Systems - General ROS: negative Psychological ROS: negative Ophthalmic ROS: negative ENT ROS: negative Allergy and Immunology ROS: negative Hematological and Lymphatic ROS: negative Endocrine ROS: negative Breast ROS: negative for breast lumps Respiratory ROS: no cough, shortness of breath, or wheezing Cardiovascular ROS: no chest pain or dyspnea on exertion Gastrointestinal ROS: no abdominal pain, change in bowel habits, or black or bloody stools Genito-Urinary ROS: no dysuria, trouble voiding, or hematuria Musculoskeletal ROS: negative Neurological ROS: no TIA or stroke symptoms Dermatological ROS: positive for lumps All other ROS negative except what is listed above and in the HPI.      Objective:    BP 121/79 (BP Location: Right Arm, Patient Position: Sitting, Cuff Size: Normal)   Pulse 65   Temp (!) 97.4 F (36.3 C) (Oral)   Ht 6\' 8"  (2.032 m)   Wt (!) 350 lb 3.2 oz (158.8 kg)   SpO2 95%   BMI 38.47 kg/m   Wt Readings from Last 3 Encounters:  03/31/19 (!) 350 lb 3.2 oz (158.8 kg)  03/20/19 (!) 348 lb (157.9 kg)  12/12/18 (!) 349 lb (158.3 kg)    Physical Exam Vitals signs and nursing note reviewed.  Constitutional:      General: He is not in acute distress.    Appearance: He is well-developed.  HENT:     Head: Atraumatic.     Right  Ear: Tympanic membrane and external ear normal.     Left Ear: Tympanic membrane and external ear normal.     Nose: Nose normal.     Mouth/Throat:     Mouth: Mucous membranes are moist.     Pharynx:  Oropharynx is clear.  Eyes:     General: No scleral icterus.    Conjunctiva/sclera: Conjunctivae normal.     Pupils: Pupils are equal, round, and reactive to light.  Neck:     Musculoskeletal: Normal range of motion and neck supple.  Cardiovascular:     Rate and Rhythm: Normal rate and regular rhythm.     Heart sounds: Normal heart sounds. No murmur.  Pulmonary:     Effort: Pulmonary effort is normal. No respiratory distress.     Breath sounds: Normal breath sounds.  Abdominal:     General: Bowel sounds are normal. There is no distension.     Palpations: Abdomen is soft. There is no mass.     Tenderness: There is no abdominal tenderness. There is no guarding.  Genitourinary:    Comments: GU exam done through Urology Musculoskeletal: Normal range of motion.        General: No tenderness.  Skin:    General: Skin is warm and dry.     Findings: Rash (scaly plaques present on multiple knuckles b/l hands) present.     Comments: 0.25 cm firm, smooth mobile mass right groin, non tender, non erythematous  Neurological:     General: No focal deficit present.     Mental Status: He is alert and oriented to person, place, and time.     Deep Tendon Reflexes: Reflexes are normal and symmetric.  Psychiatric:        Mood and Affect: Mood normal.        Behavior: Behavior normal.        Thought Content: Thought content normal.        Judgment: Judgment normal.     Results for orders placed or performed in visit on 03/31/19  HIV Antibody (routine testing w rflx)  Result Value Ref Range   HIV Screen 4th Generation wRfx Non Reactive Non Reactive  CBC with Differential/Platelet  Result Value Ref Range   WBC 6.8 3.4 - 10.8 x10E3/uL   RBC 5.34 4.14 - 5.80 x10E6/uL   Hemoglobin 14.6 13.0 - 17.7  g/dL   Hematocrit 16.1 09.6 - 51.0 %   MCV 84 79 - 97 fL   MCH 27.3 26.6 - 33.0 pg   MCHC 32.6 31.5 - 35.7 g/dL   RDW 04.5 40.9 - 81.1 %   Platelets 214 150 - 450 x10E3/uL   Neutrophils 52 Not Estab. %   Lymphs 37 Not Estab. %   Monocytes 8 Not Estab. %   Eos 2 Not Estab. %   Basos 1 Not Estab. %   Neutrophils Absolute 3.6 1.4 - 7.0 x10E3/uL   Lymphocytes Absolute 2.5 0.7 - 3.1 x10E3/uL   Monocytes Absolute 0.5 0.1 - 0.9 x10E3/uL   EOS (ABSOLUTE) 0.1 0.0 - 0.4 x10E3/uL   Basophils Absolute 0.1 0.0 - 0.2 x10E3/uL   Immature Granulocytes 0 Not Estab. %   Immature Grans (Abs) 0.0 0.0 - 0.1 x10E3/uL  Comprehensive metabolic panel  Result Value Ref Range   Glucose 112 (H) 65 - 99 mg/dL   BUN 15 6 - 20 mg/dL   Creatinine, Ser 9.14 0.76 - 1.27 mg/dL   GFR calc non Af Amer 107 >59 mL/min/1.73   GFR calc Af Amer 124 >59 mL/min/1.73   BUN/Creatinine Ratio 16 9 - 20   Sodium 140 134 - 144 mmol/L   Potassium 4.4 3.5 - 5.2 mmol/L   Chloride 101 96 - 106 mmol/L   CO2 27 20 - 29 mmol/L  Calcium 9.5 8.7 - 10.2 mg/dL   Total Protein 7.2 6.0 - 8.5 g/dL   Albumin 4.2 4.0 - 5.0 g/dL   Globulin, Total 3.0 1.5 - 4.5 g/dL   Albumin/Globulin Ratio 1.4 1.2 - 2.2   Bilirubin Total 0.3 0.0 - 1.2 mg/dL   Alkaline Phosphatase 92 39 - 117 IU/L   AST 69 (H) 0 - 40 IU/L   ALT 102 (H) 0 - 44 IU/L  Lipid Panel w/o Chol/HDL Ratio  Result Value Ref Range   Cholesterol, Total 146 100 - 199 mg/dL   Triglycerides 390 (H) 0 - 149 mg/dL   HDL 43 >30 mg/dL   VLDL Cholesterol Cal 35 5 - 40 mg/dL   LDL Chol Calc (NIH) 68 0 - 99 mg/dL  UA/M w/rflx Culture, Routine   Specimen: Urine   URINE  Result Value Ref Range   Specific Gravity, UA 1.025 1.005 - 1.030   pH, UA 5.0 5.0 - 7.5   Color, UA Yellow Yellow   Appearance Ur Clear Clear   Leukocytes,UA Negative Negative   Protein,UA Negative Negative/Trace   Glucose, UA Negative Negative   Ketones, UA Negative Negative   RBC, UA Negative Negative    Bilirubin, UA Negative Negative   Urobilinogen, Ur 0.2 0.2 - 1.0 mg/dL   Nitrite, UA Negative Negative      Assessment & Plan:   Problem List Items Addressed This Visit    None    Visit Diagnoses    Groin mass    -  Primary   Appears cystic, continue to monitor. Pt declines surgical removal at this time. F/u if growing/changing or becoming painful   Annual physical exam       Relevant Orders   CBC with Differential/Platelet (Completed)   Comprehensive metabolic panel (Completed)   Lipid Panel w/o Chol/HDL Ratio (Completed)   UA/M w/rflx Culture, Routine (Completed)   Rash       Appears psoriatic, tx with prn clobetasol, daily moisturizers.    Encounter for screening for HIV       Relevant Orders   HIV Antibody (routine testing w rflx) (Completed)       Discussed aspirin prophylaxis for myocardial infarction prevention and decision was it was not indicated  LABORATORY TESTING:  Health maintenance labs ordered today as discussed above.   The natural history of prostate cancer and ongoing controversy regarding screening and potential treatment outcomes of prostate cancer has been discussed with the patient. The meaning of a false positive PSA and a false negative PSA has been discussed. He indicates understanding of the limitations of this screening test and wishes not to proceed with screening PSA testing.   IMMUNIZATIONS:   - Tdap: Tetanus vaccination status reviewed: last tetanus booster within 10 years. - Influenza: Refused  PATIENT COUNSELING:    Sexuality: Discussed sexually transmitted diseases, partner selection, use of condoms, avoidance of unintended pregnancy  and contraceptive alternatives.   Advised to avoid cigarette smoking.  I discussed with the patient that most people either abstain from alcohol or drink within safe limits (<=14/week and <=4 drinks/occasion for males, <=7/weeks and <= 3 drinks/occasion for females) and that the risk for alcohol disorders  and other health effects rises proportionally with the number of drinks per week and how often a drinker exceeds daily limits.  Discussed cessation/primary prevention of drug use and availability of treatment for abuse.   Diet: Encouraged to adjust caloric intake to maintain  or achieve ideal body weight, to reduce  intake of dietary saturated fat and total fat, to limit sodium intake by avoiding high sodium foods and not adding table salt, and to maintain adequate dietary potassium and calcium preferably from fresh fruits, vegetables, and low-fat dairy products.    stressed the importance of regular exercise  Injury prevention: Discussed safety belts, safety helmets, smoke detector, smoking near bedding or upholstery.   Dental health: Discussed importance of regular tooth brushing, flossing, and dental visits.   Follow up plan: NEXT PREVENTATIVE PHYSICAL DUE IN 1 YEAR. Return in about 1 year (around 03/30/2020) for CPE.

## 2019-04-01 LAB — CBC WITH DIFFERENTIAL/PLATELET
Basophils Absolute: 0.1 10*3/uL (ref 0.0–0.2)
Basos: 1 %
EOS (ABSOLUTE): 0.1 10*3/uL (ref 0.0–0.4)
Eos: 2 %
Hematocrit: 44.8 % (ref 37.5–51.0)
Hemoglobin: 14.6 g/dL (ref 13.0–17.7)
Immature Grans (Abs): 0 10*3/uL (ref 0.0–0.1)
Immature Granulocytes: 0 %
Lymphocytes Absolute: 2.5 10*3/uL (ref 0.7–3.1)
Lymphs: 37 %
MCH: 27.3 pg (ref 26.6–33.0)
MCHC: 32.6 g/dL (ref 31.5–35.7)
MCV: 84 fL (ref 79–97)
Monocytes Absolute: 0.5 10*3/uL (ref 0.1–0.9)
Monocytes: 8 %
Neutrophils Absolute: 3.6 10*3/uL (ref 1.4–7.0)
Neutrophils: 52 %
Platelets: 214 10*3/uL (ref 150–450)
RBC: 5.34 x10E6/uL (ref 4.14–5.80)
RDW: 14.3 % (ref 11.6–15.4)
WBC: 6.8 10*3/uL (ref 3.4–10.8)

## 2019-04-01 LAB — COMPREHENSIVE METABOLIC PANEL
ALT: 102 IU/L — ABNORMAL HIGH (ref 0–44)
AST: 69 IU/L — ABNORMAL HIGH (ref 0–40)
Albumin/Globulin Ratio: 1.4 (ref 1.2–2.2)
Albumin: 4.2 g/dL (ref 4.0–5.0)
Alkaline Phosphatase: 92 IU/L (ref 39–117)
BUN/Creatinine Ratio: 16 (ref 9–20)
BUN: 15 mg/dL (ref 6–20)
Bilirubin Total: 0.3 mg/dL (ref 0.0–1.2)
CO2: 27 mmol/L (ref 20–29)
Calcium: 9.5 mg/dL (ref 8.7–10.2)
Chloride: 101 mmol/L (ref 96–106)
Creatinine, Ser: 0.93 mg/dL (ref 0.76–1.27)
GFR calc Af Amer: 124 mL/min/{1.73_m2} (ref >59)
GFR calc non Af Amer: 107 mL/min/{1.73_m2} (ref >59)
Globulin, Total: 3 g/dL (ref 1.5–4.5)
Glucose: 112 mg/dL — ABNORMAL HIGH (ref 65–99)
Potassium: 4.4 mmol/L (ref 3.5–5.2)
Sodium: 140 mmol/L (ref 134–144)
Total Protein: 7.2 g/dL (ref 6.0–8.5)

## 2019-04-01 LAB — LIPID PANEL W/O CHOL/HDL RATIO
Cholesterol, Total: 146 mg/dL (ref 100–199)
HDL: 43 mg/dL (ref >39)
LDL Chol Calc (NIH): 68 mg/dL (ref 0–99)
Triglycerides: 210 mg/dL — ABNORMAL HIGH (ref 0–149)
VLDL Cholesterol Cal: 35 mg/dL (ref 5–40)

## 2019-04-01 LAB — HIV ANTIBODY (ROUTINE TESTING W REFLEX): HIV Screen 4th Generation wRfx: NONREACTIVE

## 2019-04-02 NOTE — Addendum Note (Signed)
Addended by: Merrie Roof E on: 04/02/2019 01:18 PM   Modules accepted: Orders

## 2019-04-09 LAB — SPECIMEN STATUS REPORT

## 2019-04-09 LAB — HEPATITIS PANEL, ACUTE
Hep A IgM: NEGATIVE
Hep B C IgM: NEGATIVE
Hep C Virus Ab: <0.1 s/co ratio (ref 0.0–0.9)
Hepatitis B Surface Ag: NEGATIVE

## 2019-06-05 ENCOUNTER — Other Ambulatory Visit: Payer: Self-pay

## 2019-06-05 ENCOUNTER — Ambulatory Visit: Payer: Managed Care, Other (non HMO) | Admitting: Urology

## 2019-06-05 ENCOUNTER — Encounter: Payer: Self-pay | Admitting: Urology

## 2019-06-05 VITALS — BP 126/83 | HR 73 | Ht >= 80 in | Wt 356.1 lb

## 2019-06-05 DIAGNOSIS — E291 Testicular hypofunction: Secondary | ICD-10-CM | POA: Diagnosis not present

## 2019-06-05 NOTE — Progress Notes (Signed)
06/05/2019 9:12 AM   Francia Greaves 23-Aug-1985 [id number]  Referring provider: Volney American, PA-C [address] [place],  [place] [address]  Chief Complaint  Patient presents with  . Hypogonadism    HPI: 32 y.o. 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  He was on [place] however has been on testosterone cypionate for approximately 3 months due to insurance approval issues.  He states the Berdine Addison was recently improved and he is going to pick up the medication today or tomorrow.  He has no bothersome lower urinary tract symptoms breast tenderness/enlargement or edema.   PMH: Past Medical History:  Diagnosis Date  . Asthma   . Hypogonadism in male   . Klinefelter syndrome   . Peripheral vascular disease ([place])   . Sleep apnea     Surgical History: Past Surgical History:  Procedure Laterality Date  . NO PAST SURGERIES      Home Medications:  Allergies as of 06/05/2019   No Known Allergies     Medication List       Accurate as of June 05, 2019  9:12 AM. If you have any questions, ask your nurse or doctor.        clobetasol ointment 0.05 % Commonly known as: TEMOVATE Apply 1 application topically 2 (two) times daily as needed.   ibuprofen 200 MG tablet Commonly known as: ADVIL Take 200 mg by mouth.   levocetirizine 5 MG tablet Commonly known as: XYZAL Take 5 mg by mouth every evening.   Luer Lock Safety Syringes 21G X 1-1/2" 3 ML Misc Generic drug: SYRINGE-NEEDLE (DISP) 3 ML Use as directed for testosterone administration   NEEDLE (DISP) 18 G 18G X 1" Misc Use as directed   testosterone cypionate 200 MG/ML injection Commonly known as: DEPOTESTOSTERONE CYPIONATE Inject 1 mL (200 mg total) into the muscle every 14 (fourteen) days.       Allergies: No Known Allergies  Family History: Family History  Problem Relation Age of Onset  . Diabetes type I Sister   . Cancer Maternal Grandmother   . Hypertension Maternal Grandfather    . Prostate cancer Neg Hx   . Bladder Cancer Neg Hx   . Kidney cancer Neg Hx     Social History:  reports that he has never smoked. He has never used smokeless tobacco. He reports current alcohol use. He reports that he does not use drugs.  ROS: UROLOGY Frequent Urination?: No Hard to postpone urination?: No Burning/pain with urination?: No Get up at night to urinate?: No Leakage of urine?: No Urine stream starts and stops?: No Trouble starting stream?: No Do you have to strain to urinate?: No Blood in urine?: No Urinary tract infection?: No Sexually transmitted disease?: No Injury to kidneys or bladder?: No Painful intercourse?: No Weak stream?: No Erection problems?: No Penile pain?: No  Gastrointestinal Nausea?: No Vomiting?: No Indigestion/heartburn?: No Diarrhea?: No Constipation?: No  Constitutional Fever: No Night sweats?: No Weight loss?: No Fatigue?: No  Skin Skin rash/lesions?: No Itching?: No  Eyes Blurred vision?: No Double vision?: No  Ears/Nose/Throat Sore throat?: No Sinus problems?: No  Hematologic/Lymphatic Swollen glands?: No Easy bruising?: No  Cardiovascular Leg swelling?: No Chest pain?: No  Respiratory Cough?: No Shortness of breath?: No  Endocrine Excessive thirst?: No  Musculoskeletal Back pain?: No Joint pain?: No  Neurological Headaches?: No Dizziness?: No  Psychologic Depression?: No Anxiety?: No  Physical Exam: BP 126/83 (BP Location: Left Arm, Patient Position: Sitting, Cuff Size: Large)  Pulse 73   Ht 6\' 8"  (2.032 m)   Wt (!) 356 lb 1.6 oz (161.5 kg)   BMI 39.12 kg/m   Constitutional:  Alert and oriented, No acute distress. HEENT: [place] AT, moist mucus membranes.  Trachea midline, no masses. Cardiovascular: No clubbing, cyanosis, or edema. Respiratory: Normal respiratory effort, no increased work of breathing.    Assessment & Plan:    - Klinefelter syndrome Testosterone and hematocrit drawn  today.  If labs okay lab visit in 6 months and office visit 1 year.  He also requested a semen analysis.   , MD  [facility] [address], [address] [place], [place] [phone]

## 2019-06-06 ENCOUNTER — Telehealth: Payer: Self-pay

## 2019-06-06 LAB — TESTOSTERONE: Testosterone: 235 ng/dL — ABNORMAL LOW (ref 264–916)

## 2019-06-06 LAB — HEMATOCRIT: Hematocrit: 51.3 % — ABNORMAL HIGH (ref 37.5–51.0)

## 2019-06-06 NOTE — Telephone Encounter (Signed)
-----   Message from Abbie Sons, MD sent at 06/06/2019  8:32 AM EST ----- As expected testosterone level slightly low at 235.  His hematocrit was slightly elevated at 51.3.  It was much lower 2 months ago at 68.  Did he give blood?

## 2019-06-06 NOTE — Telephone Encounter (Signed)
Called pt informed him of the information below. Pt gave verbal understanding. Pt states that he has not donated in about 3 months.

## 2019-06-11 ENCOUNTER — Other Ambulatory Visit: Payer: Self-pay | Admitting: Family Medicine

## 2019-06-13 ENCOUNTER — Other Ambulatory Visit: Payer: Managed Care, Other (non HMO)

## 2019-07-01 ENCOUNTER — Other Ambulatory Visit: Payer: Self-pay

## 2019-07-02 ENCOUNTER — Other Ambulatory Visit: Payer: Self-pay

## 2019-07-02 DIAGNOSIS — Q984 Klinefelter syndrome, unspecified: Secondary | ICD-10-CM

## 2019-07-02 DIAGNOSIS — E291 Testicular hypofunction: Secondary | ICD-10-CM

## 2019-07-02 MED ORDER — XYOSTED 100 MG/0.5ML ~~LOC~~ SOAJ: 100.0000 mg | SUBCUTANEOUS | 3 refills | Status: DC

## 2019-07-02 NOTE — Telephone Encounter (Signed)
[place] state

## 2019-07-10 ENCOUNTER — Other Ambulatory Visit: Payer: Self-pay | Admitting: Urology

## 2019-07-11 LAB — SEMEN ANALYSIS, BASIC
Appearance: NORMAL
Concentration, Sperm: NONE SEEN x10E6/mL (ref >14.9)
Time Collected: 1238
Time Received: 1300
Time Since Last Emission: 2 days
Volume: 0.8 mL — ABNORMAL LOW (ref >1.4)
pH: 8.5 (ref >7.1)

## 2019-07-16 ENCOUNTER — Telehealth: Payer: Self-pay

## 2019-07-16 NOTE — Telephone Encounter (Signed)
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

## 2019-07-16 NOTE — Telephone Encounter (Signed)
See mychart.  

## 2019-10-21 ENCOUNTER — Other Ambulatory Visit: Payer: Self-pay | Admitting: *Deleted

## 2019-10-21 DIAGNOSIS — E291 Testicular hypofunction: Secondary | ICD-10-CM

## 2019-10-21 DIAGNOSIS — Q984 Klinefelter syndrome, unspecified: Secondary | ICD-10-CM

## 2019-10-21 MED ORDER — XYOSTED 100 MG/0.5ML ~~LOC~~ SOAJ: 100.0000 mg | SUBCUTANEOUS | 3 refills | Status: DC

## 2019-12-03 ENCOUNTER — Other Ambulatory Visit: Payer: Self-pay

## 2019-12-03 DIAGNOSIS — Q984 Klinefelter syndrome, unspecified: Secondary | ICD-10-CM

## 2019-12-03 DIAGNOSIS — E291 Testicular hypofunction: Secondary | ICD-10-CM

## 2019-12-04 ENCOUNTER — Other Ambulatory Visit: Payer: Self-pay

## 2019-12-04 ENCOUNTER — Other Ambulatory Visit: Payer: Managed Care, Other (non HMO)

## 2019-12-04 DIAGNOSIS — Q984 Klinefelter syndrome, unspecified: Secondary | ICD-10-CM

## 2019-12-04 DIAGNOSIS — E291 Testicular hypofunction: Secondary | ICD-10-CM

## 2019-12-05 LAB — TESTOSTERONE: Testosterone: 438 ng/dL (ref 264–916)

## 2019-12-05 LAB — HEMOGLOBIN AND HEMATOCRIT, BLOOD
Hematocrit: 51.7 % — ABNORMAL HIGH (ref 37.5–51.0)
Hemoglobin: 16.4 g/dL (ref 13.0–17.7)

## 2019-12-08 ENCOUNTER — Telehealth: Payer: Self-pay | Admitting: *Deleted

## 2019-12-08 NOTE — Telephone Encounter (Signed)
-----   Message from Riki Altes, MD sent at 12/08/2019 11:05 AM EDT ----- Testosterone level was 438.  Hematocrit 51.7

## 2019-12-08 NOTE — Telephone Encounter (Signed)
Notified patient as instructed, patient pleased. Discussed follow-up appointments, patient agrees  

## 2020-04-01 ENCOUNTER — Encounter: Payer: Managed Care, Other (non HMO) | Admitting: Family Medicine

## 2020-04-01 ENCOUNTER — Encounter: Payer: Self-pay | Admitting: Unknown Physician Specialty

## 2020-04-01 ENCOUNTER — Ambulatory Visit (INDEPENDENT_AMBULATORY_CARE_PROVIDER_SITE_OTHER): Payer: Managed Care, Other (non HMO) | Admitting: Unknown Physician Specialty

## 2020-04-01 ENCOUNTER — Other Ambulatory Visit: Payer: Self-pay

## 2020-04-01 VITALS — BP 124/85 | HR 74 | Temp 98.4°F | Ht >= 80 in | Wt 355.0 lb

## 2020-04-01 DIAGNOSIS — Z23 Encounter for immunization: Secondary | ICD-10-CM | POA: Diagnosis not present

## 2020-04-01 DIAGNOSIS — E291 Testicular hypofunction: Secondary | ICD-10-CM

## 2020-04-01 DIAGNOSIS — G4730 Sleep apnea, unspecified: Secondary | ICD-10-CM | POA: Insufficient documentation

## 2020-04-01 DIAGNOSIS — M222X1 Patellofemoral disorders, right knee: Secondary | ICD-10-CM

## 2020-04-01 DIAGNOSIS — Z0000 Encounter for general adult medical examination without abnormal findings: Secondary | ICD-10-CM | POA: Diagnosis not present

## 2020-04-01 DIAGNOSIS — G4733 Obstructive sleep apnea (adult) (pediatric): Secondary | ICD-10-CM

## 2020-04-01 NOTE — Assessment & Plan Note (Signed)
Uses CPAP nightly 

## 2020-04-01 NOTE — Assessment & Plan Note (Signed)
Seeing neurology for testosterone injections

## 2020-04-01 NOTE — Progress Notes (Signed)
BP 124/85 (BP Location: Left Arm, Patient Position: Sitting, Cuff Size: Large)   Pulse 74   Temp 98.4 F (36.9 C) (Oral)   Ht 6\' 8"  (2.032 m)   Wt (!) 355 lb (161 kg)   SpO2 97%   BMI 39.00 kg/m    Subjective:    Patient ID: Collin Butler, male    DOB: May 15, 1986, 34 y.o.   MRN: [id number]  HPI: Collin Butler is a 34 y.o. male  Chief Complaint  Patient presents with  . Annual Exam   Pt is here for general history and physical.  Reports knee pain for about a month.  Reports no injuries but perhaps started after a long road trip.    Knee Pain  There was no injury mechanism. The pain is present in the right knee. Pain course: Worse with getting up.  Better once walking. The symptoms are aggravated by weight bearing. He has tried nothing for the symptoms.    Sleep apnea Uses CPAP QHS.  States if he doesn't use it he feels awful.     Social History   Socioeconomic History  . Marital status: Married    Spouse name: Not on file  . Number of children: Not on file  . Years of education: Not on file  . Highest education level: Not on file  Occupational History  . Not on file  Tobacco Use  . Smoking status: Never Smoker  . Smokeless tobacco: Never Used  Vaping Use  . Vaping Use: Never used  Substance and Sexual Activity  . Alcohol use: Yes    Comment: rare  . Drug use: No  . Sexual activity: Yes    Birth control/protection: None  Other Topics Concern  . Not on file  Social History Narrative  . Not on file   Social Determinants of Health   Financial Resource Strain:   . Difficulty of Paying Living Expenses: Not on file  Food Insecurity:   . Worried About 20 in the Last Year: Not on file  . Ran Out of Food in the Last Year: Not on file  Transportation Needs:   . Lack of Transportation (Medical): Not on file  . Lack of Transportation (Non-Medical): Not on file  Physical Activity:   . Days of Exercise per Week: Not on file  . Minutes of  Exercise per Session: Not on file  Stress:   . Feeling of Stress : Not on file  Social Connections:   . Frequency of Communication with Friends and Family: Not on file  . Frequency of Social Gatherings with Friends and Family: Not on file  . Attends Religious Services: Not on file  . Active Member of Clubs or Organizations: Not on file  . Attends Programme researcher, broadcasting/film/video Meetings: Not on file  . Marital Status: Not on file  Intimate Partner Violence:   . Fear of Current or Ex-Partner: Not on file  . Emotionally Abused: Not on file  . Physically Abused: Not on file  . Sexually Abused: Not on file   Family History  Problem Relation Age of Onset  . Diabetes type I Sister   . Cancer Maternal Grandmother   . Hypertension Maternal Grandfather   . Prostate cancer Neg Hx   . Bladder Cancer Neg Hx   . Kidney cancer Neg Hx    Past Medical History:  Diagnosis Date  . Asthma   . Hypogonadism in male   . Klinefelter syndrome   .  Peripheral vascular disease ([facility])   . Sleep apnea    Depression screen [facility] 2/9 04/01/2020 03/31/2019 03/27/2018 07/12/2017 04/26/2017  Decreased Interest 0 0 0 0 0  Down, Depressed, Hopeless 0 0 0 0 0  PHQ - 2 Score 0 0 0 0 0  Altered sleeping - 1 1 - -  Tired, decreased energy - 2 1 - -  Change in appetite - 0 0 - -  Feeling bad or failure about yourself  - 1 0 - -  Trouble concentrating - 0 0 - -  Moving slowly or fidgety/restless - 0 0 - -  Suicidal thoughts - 0 0 - -  PHQ-9 Score - 4 2 - -    Relevant past medical, surgical, family and social history reviewed and updated as indicated. Interim medical history since our last visit reviewed. Allergies and medications reviewed and updated.  Review of Systems  Per HPI unless specifically indicated above     Objective:    BP 124/85 (BP Location: Left Arm, Patient Position: Sitting, Cuff Size: Large)   Pulse 74   Temp 98.4 F (36.9 C) (Oral)   Ht 6\' 8"  (2.032 m)   Wt (!) 355 lb (161 kg)   SpO2 97%    BMI 39.00 kg/m   Wt Readings from Last 3 Encounters:  04/01/20 (!) 355 lb (161 kg)  06/05/19 (!) 356 lb 1.6 oz (161.5 kg)  03/31/19 (!) 350 lb 3.2 oz (158.8 kg)    Physical Exam Constitutional:      Appearance: He is well-developed.  HENT:     Head: Normocephalic.     Right Ear: Tympanic membrane, ear canal and external ear normal.     Left Ear: Tympanic membrane, ear canal and external ear normal.     Mouth/Throat:     Pharynx: Uvula midline.  Eyes:     Pupils: Pupils are equal, round, and reactive to light.  Cardiovascular:     Rate and Rhythm: Normal rate and regular rhythm.     Heart sounds: Normal heart sounds. No murmur heard.  No friction rub. No gallop.   Pulmonary:     Effort: Pulmonary effort is normal. No respiratory distress.     Breath sounds: Normal breath sounds.  Abdominal:     General: Bowel sounds are normal. There is no distension.     Palpations: Abdomen is soft.     Tenderness: There is no abdominal tenderness.  Musculoskeletal:        General: Normal range of motion.     Right knee: No swelling, deformity, effusion, erythema or bony tenderness. Normal range of motion. No tenderness. Abnormal alignment and abnormal patellar mobility. Normal meniscus.     Left knee: No swelling, deformity, effusion or bony tenderness. Normal range of motion. No tenderness. Normal alignment, normal meniscus and normal patellar mobility.  Skin:    General: Skin is warm and dry.     Comments: Lower leg discoloration and scarring secondary to wound  Neurological:     Mental Status: He is alert and oriented to person, place, and time.     Deep Tendon Reflexes: Reflexes are normal and symmetric.  Psychiatric:        Behavior: Behavior normal.        Thought Content: Thought content normal.        Judgment: Judgment normal.     Results for orders placed or performed in visit on 12/04/19  Hemoglobin and hematocrit, blood  Result Value Ref Range  Hemoglobin 16.4 13.0 -  17.7 g/dL   Hematocrit 62.5 (H) 63.8 - 51.0 %  Testosterone  Result Value Ref Range   Testosterone 438 264 - 916 ng/dL      Assessment & Plan:   Problem List Items Addressed This Visit      Unprioritized   Sleep apnea    Uses CPAP nightly      Testicular hypofunction    Seeing neurology for testosterone injections       Other Visit Diagnoses    Influenza vaccine needed    -  Primary   Relevant Orders   Flu Vaccine QUAD 36+ mos IM   Annual physical exam       Relevant Orders   CBC with Differential/Platelet   Comprehensive metabolic panel   TSH   Lipid Panel w/o Chol/HDL Ratio   Patellofemoral disorder, right       Discussed knee exercises.  Tylenol or Ibuprofen prn.  Handout given for stretching and strengthening of knee      Updated flu vaccine today Had Covid vaccines   Follow up plan: Return in about 1 year (around 04/01/2021).

## 2020-04-01 NOTE — Patient Instructions (Addendum)
Patellofemoral Pain Syndrome  Patellofemoral pain syndrome is a condition in which the tissue (cartilage) on the underside of the kneecap (patella) softens or breaks down. This causes pain in the front of the knee. The condition is also called runner's knee or chondromalacia patella. Patellofemoral pain syndrome is most common in young adults who are active in sports. The knee is the largest joint in the body. The patella covers the front of the knee and is attached to muscles above and below the knee. The underside of the patella is covered with a smooth type of cartilage (synovium). The smooth surface helps the patella to glide easily when you move your knee. Patellofemoral pain syndrome causes swelling in the joint linings and bone surfaces in the knee. What are the causes? This condition may be caused by:  Overuse of the knee.  Poor alignment of your knee joints.  Weak leg muscles.  A direct blow to your kneecap. What increases the risk? You are more likely to develop this condition if:  You do a lot of activities that can wear down your kneecap. These include: ? Running. ? Squatting. ? Climbing stairs.  You start a new physical activity or exercise program.  You wear shoes that do not fit well.  You do not have good leg strength.  You are overweight. What are the signs or symptoms? The main symptom of this condition is knee pain. This may feel like a dull, aching pain underneath your patella, in the front of your knee. There may be a popping or cracking sound when you move your knee. Pain may get worse with:  Exercise.  Climbing stairs.  Running.  Jumping.  Squatting.  Kneeling.  Sitting for a long time.  Moving or pushing on your patella. How is this diagnosed? This condition may be diagnosed based on:  Your symptoms and medical history. You may be asked about your recent physical activities and which ones cause knee pain.  A physical exam. This may  include: ? Moving your patella back and forth. ? Checking your range of knee motion. ? Having you squat or jump to see if you have pain. ? Checking the strength of your leg muscles.  Imaging tests to confirm the diagnosis. These may include an MRI of your knee. How is this treated? This condition may be treated at home with rest, ice, compression, and elevation (RICE).  Other treatments may include:  Nonsteroidal anti-inflammatory drugs (NSAIDs).  Physical therapy to stretch and strengthen your leg muscles.  Shoe inserts (orthotics) to take stress off your knee.  A knee brace or knee support.  Adhesive tapes to the skin.  Surgery to remove damaged cartilage or move the patella to a better position. This is rare. Follow these instructions at home: If you have a shoe or brace:  Wear the shoe or brace as told by your health care provider. Remove it only as told by your health care provider.  Loosen the shoe or brace if your toes tingle, become numb, or turn cold and blue.  Keep the shoe or brace clean.  If the shoe or brace is not waterproof: ? Do not let it get wet. ? Cover it with a watertight covering when you take a bath or a shower. Managing pain, stiffness, and swelling  If directed, put ice on the painful area. ? If you have a removable shoe or brace, remove it as told by your health care provider. ? Put ice in a plastic bag. ?  Place a towel between your skin and the bag. ? Leave the ice on for 20 minutes, 2-3 times a day.  Move your toes often to avoid stiffness and to lessen swelling.  Rest your knee: ? Avoid activities that cause knee pain. ? When sitting or lying down, raise (elevate) the injured area above the level of your heart, whenever possible. General instructions  Take over-the-counter and prescription medicines only as told by your health care provider.  Use splints, braces, knee supports, or walking aids as directed by your health care  provider.  Perform stretching and strengthening exercises as told by your health care provider or physical therapist.  Do not use any products that contain nicotine or tobacco, such as cigarettes and e-cigarettes. These can delay healing. If you need help quitting, ask your health care provider.  Return to your normal activities as told by your health care provider. Ask your health care provider what activities are safe for you.  Keep all follow-up visits as told by your health care provider. This is important. Contact a health care provider if:  Your symptoms get worse.  You are not improving with home care. Summary  Patellofemoral pain syndrome is a condition in which the tissue (cartilage) on the underside of the kneecap (patella) softens or breaks down.  This condition causes swelling in the joint linings and bone surfaces in the knee. This leads to pain in the front of the knee.  This condition may be treated at home with rest, ice, compression, and elevation (RICE).  Use splints, braces, knee supports, or walking aids as directed by your health care provider. This information is not intended to replace advice given to you by your health care provider. Make sure you discuss any questions you have with your health care provider. Document Revised: 07/30/2017 Document Reviewed: 07/30/2017 Elsevier Patient Education  2020 [place] 34-41 Years Old, Male Preventive care refers to lifestyle choices and visits with your health care provider that can promote health and wellness. This includes:  A yearly physical exam. This is also called an annual well check.  Regular dental and eye exams.  Immunizations.  Screening for certain conditions.  Healthy lifestyle choices, such as eating a healthy diet, getting regular exercise, not using drugs or products that contain nicotine and tobacco, and limiting alcohol use. What can I expect for my preventive care  visit? Physical exam Your health care provider will check:  Height and weight. These may be used to calculate body mass index (BMI), which is a measurement that tells if you are at a healthy weight.  Heart rate and blood pressure.  Your skin for abnormal spots. Counseling Your health care provider may ask you questions about:  Alcohol, tobacco, and drug use.  Emotional well-being.  Home and relationship well-being.  Sexual activity.  Eating habits.  Work and work Statistician. What immunizations do I need?  Influenza (flu) vaccine  This is recommended every year. Tetanus, diphtheria, and pertussis (Tdap) vaccine  You may need a Td booster every 10 years. Varicella (chickenpox) vaccine  You may need this vaccine if you have not already been vaccinated. Human papillomavirus (HPV) vaccine  If recommended by your health care provider, you may need three doses over 6 months. Measles, mumps, and rubella (MMR) vaccine  You may need at least one dose of MMR. You may also need a second dose. Meningococcal conjugate (MenACWY) vaccine  One dose is recommended if you are 62-4 years old  and a Market researcher living in a residence hall, or if you have one of several medical conditions. You may also need additional booster doses. Pneumococcal conjugate (PCV13) vaccine  You may need this if you have certain conditions and were not previously vaccinated. Pneumococcal polysaccharide (PPSV23) vaccine  You may need one or two doses if you smoke cigarettes or if you have certain conditions. Hepatitis A vaccine  You may need this if you have certain conditions or if you travel or work in places where you may be exposed to hepatitis A. Hepatitis B vaccine  You may need this if you have certain conditions or if you travel or work in places where you may be exposed to hepatitis B. Haemophilus influenzae type b (Hib) vaccine  You may need this if you have certain risk  factors. You may receive vaccines as individual doses or as more than one vaccine together in one shot (combination vaccines). Talk with your health care provider about the risks and benefits of combination vaccines. What tests do I need? Blood tests  Lipid and cholesterol levels. These may be checked every 5 years starting at age 13.  Hepatitis C test.  Hepatitis B test. Screening   Diabetes screening. This is done by checking your blood sugar (glucose) after you have not eaten for a while (fasting).  Sexually transmitted disease (STD) testing. Talk with your health care provider about your test results, treatment options, and if necessary, the need for more tests. Follow these instructions at home: Eating and drinking   Eat a diet that includes fresh fruits and vegetables, whole grains, lean protein, and low-fat dairy products.  Take vitamin and mineral supplements as recommended by your health care provider.  Do not drink alcohol if your health care provider tells you not to drink.  If you drink alcohol: ? Limit how much you have to 0-2 drinks a day. ? Be aware of how much alcohol is in your drink. In the [place] one drink equals one 12 oz bottle of beer (355 mL), one 5 oz glass of wine (148 mL), or one 1 oz glass of hard liquor (44 mL). Lifestyle  Take daily care of your teeth and gums.  Stay active. Exercise for at least 30 minutes on 5 or more days each week.  Do not use any products that contain nicotine or tobacco, such as cigarettes, e-cigarettes, and chewing tobacco. If you need help quitting, ask your health care provider.  If you are sexually active, practice safe sex. Use a condom or other form of protection to prevent STIs (sexually transmitted infections). What's next?  Go to your health care provider once a year for a well check visit.  Ask your health care provider how often you should have your eyes and teeth checked.  Stay up to date on all  vaccines. This information is not intended to replace advice given to you by your health care provider. Make sure you discuss any questions you have with your health care provider. Document Revised: 06/13/2018 Document Reviewed: 06/13/2018 Elsevier Patient Education  [place].  Patellar Tendinitis Rehab Ask your health care provider which exercises are safe for you. Do exercises exactly as told by your health care provider and adjust them as directed. It is normal to feel mild stretching, pulling, tightness, or discomfort as you do these exercises. Stop right away if you feel sudden pain or your pain gets worse. Do not begin these exercises until told by your health  care provider. Stretching and range-of-motion exercise This exercise warms up your muscles and joints and improves the movement and flexibility of your knee. The exercise also helps to relieve pain and stiffness. Hamstring, doorway stretch This is an exercise in which you lie in a doorway and prop your leg on a wall to stretch the back of your knee and thigh (hamstring). 1. Lie on your back in front of a doorway with your left / right leg resting against the wall and your other leg flat on the floor in the doorway. There should be a slight bend in your left / right knee. 2. Straighten your left / right knee. You should feel a stretch behind your knee or thigh. If you do not, scoot your buttocks closer to the door. 3. Hold this position for __________ seconds. Repeat __________ times. Complete this exercise __________ times a day. Strengthening exercises These exercises build strength and endurance in your knee. Endurance is the ability to use your muscles for a long time, even after they get tired. Quadriceps, isometric This exercise stretches the muscles in front of your thigh (quadriceps) without moving your knee joint (isometric). 1. Lie on your back with your left / right leg extended and your other knee bent. 2. Slowly  tense the muscles in the front of your left / right thigh. When you do this, you should see your kneecap slide up toward your hip or see increased dimpling just above the knee. This motion will push the back of your knee toward the floor. If this is painful, try putting a rolled-up hand towel under your knee to support it in a bent position. Change the size of the towel to find a position that allows you to do this exercise without any pain. 3. For __________ seconds, hold the muscle as tight as you can without increasing your pain. 4. Relax the muscles slowly and completely. Repeat __________ times. Complete this exercise __________ times a day. Straight leg raises This exercise stretches the muscles in front of your thigh (quadriceps) and the muscles that move your hips (hip flexors). 1. Lie on your back with your left / right leg extended and your other knee bent. 2. Tense the muscles in the front of your left / right thigh. When you do this, you should see your kneecap slide up or see increased dimpling just above the knee. 3. Keep these muscles tight as you raise your leg 4-6 inches (10-15 cm) off the floor. Do not let your moving knee bend. 4. Hold this position for __________ seconds. 5. Keep these muscles tense as you slowly lower your leg. 6. Relax your muscles slowly and completely. Repeat __________ times. Complete this exercise __________ times a day. Squats This is a weight-bearing exercise in which you bend your knees and lower your hips while engaging your thigh muscles. 1. Stand in front of a table, with your feet and knees pointing straight ahead. You may rest your hands on the table for balance but not for support. 2. Slowly bend your knees and lower your hips like you are going to sit in a chair. ? Keep your weight over your heels, not over your toes. ? Keep your lower legs upright so they are parallel with the table legs. ? Do not let your hips go lower than your knees. ? Do  not bend lower than told by your health care provider. ? If your knee pain increases, do not bend as low. 3. Hold the squat  position for __________ seconds. 4. Slowly push with your legs to return to standing. Do not use your hands to pull yourself to standing. Repeat __________ times. Complete this exercise __________ times a day. Step-downs This is an exercise in which you step down slowly while engaging your leg muscles. 1. Stand on the edge of a step. 2. Keeping your weight over your __________ heel, slowly bend your __________ knee to bring your __________ heel toward the floor. Lower your heel as far as you can while keeping control and without increasing any discomfort. ? Do not let your __________ knee come forward. ? Use your leg muscles, not gravity, to lower your body. ? Hold a wall or rail for balance if needed. 3. Slowly push through your heel to lift your body weight back up. 4. Return to the starting position. Repeat __________ times. Complete this exercise __________ times a day. Straight leg raises This exercise strengthens the muscles that rotate the leg at the hip and move it away from your body (hip abductors). 1. Lie on your side with your left / right leg in the top position. Lie so your head, shoulder, knee, and hip line up. You may bend your lower knee to help you keep your balance. 2. Roll your hips slightly forward, so that your hips are stacked directly over each other and your left / right knee is facing forward. 3. Leading with your heel, lift your top leg 4-6 inches (10-15 cm). You should feel the muscles in your outer hip lifting. ? Do not let your foot drift forward. ? Do not let your knee roll toward the ceiling. 4. Hold this position for __________ seconds. 5. Slowly lower your leg to the starting position. 6. Let your muscles relax completely after each repetition. Repeat __________ times. Complete this exercise __________ times a day. This information is  not intended to replace advice given to you by your health care provider. Make sure you discuss any questions you have with your health care provider. Document Revised: 10/10/2018 Document Reviewed: 04/09/2018 Elsevier Patient Education  [place].

## 2020-04-02 ENCOUNTER — Other Ambulatory Visit: Payer: Self-pay | Admitting: Unknown Physician Specialty

## 2020-04-02 DIAGNOSIS — R7989 Other specified abnormal findings of blood chemistry: Secondary | ICD-10-CM

## 2020-04-02 DIAGNOSIS — R7309 Other abnormal glucose: Secondary | ICD-10-CM

## 2020-04-02 LAB — TSH: TSH: 5.93 u[IU]/mL — ABNORMAL HIGH (ref 0.450–4.500)

## 2020-04-02 LAB — CBC WITH DIFFERENTIAL/PLATELET
Basophils Absolute: 0.1 10*3/uL (ref 0.0–0.2)
Basos: 1 %
EOS (ABSOLUTE): 0.2 10*3/uL (ref 0.0–0.4)
Eos: 2 %
Hematocrit: 47.9 % (ref 37.5–51.0)
Hemoglobin: 16.2 g/dL (ref 13.0–17.7)
Immature Grans (Abs): 0 10*3/uL (ref 0.0–0.1)
Immature Granulocytes: 0 %
Lymphocytes Absolute: 3.3 10*3/uL — ABNORMAL HIGH (ref 0.7–3.1)
Lymphs: 33 %
MCH: 27.7 pg (ref 26.6–33.0)
MCHC: 33.8 g/dL (ref 31.5–35.7)
MCV: 82 fL (ref 79–97)
Monocytes Absolute: 0.7 10*3/uL (ref 0.1–0.9)
Monocytes: 7 %
Neutrophils Absolute: 5.8 10*3/uL (ref 1.4–7.0)
Neutrophils: 57 %
Platelets: 237 10*3/uL (ref 150–450)
RBC: 5.84 x10E6/uL — ABNORMAL HIGH (ref 4.14–5.80)
RDW: 14.6 % (ref 11.6–15.4)
WBC: 10 10*3/uL (ref 3.4–10.8)

## 2020-04-02 LAB — COMPREHENSIVE METABOLIC PANEL
ALT: 112 IU/L — ABNORMAL HIGH (ref 0–44)
AST: 96 IU/L — ABNORMAL HIGH (ref 0–40)
Albumin/Globulin Ratio: 1.6 (ref 1.2–2.2)
Albumin: 4.4 g/dL (ref 4.0–5.0)
Alkaline Phosphatase: 94 IU/L (ref 44–121)
BUN/Creatinine Ratio: 10 (ref 9–20)
BUN: 10 mg/dL (ref 6–20)
Bilirubin Total: 0.6 mg/dL (ref 0.0–1.2)
CO2: 26 mmol/L (ref 20–29)
Calcium: 9.9 mg/dL (ref 8.7–10.2)
Chloride: 97 mmol/L (ref 96–106)
Creatinine, Ser: 1.02 mg/dL (ref 0.76–1.27)
GFR calc Af Amer: 110 mL/min/{1.73_m2} (ref >59)
GFR calc non Af Amer: 95 mL/min/{1.73_m2} (ref >59)
Globulin, Total: 2.7 g/dL (ref 1.5–4.5)
Glucose: 206 mg/dL — ABNORMAL HIGH (ref 65–99)
Potassium: 4.6 mmol/L (ref 3.5–5.2)
Sodium: 137 mmol/L (ref 134–144)
Total Protein: 7.1 g/dL (ref 6.0–8.5)

## 2020-04-02 LAB — LIPID PANEL W/O CHOL/HDL RATIO
Cholesterol, Total: 154 mg/dL (ref 100–199)
HDL: 33 mg/dL — ABNORMAL LOW (ref >39)
LDL Chol Calc (NIH): 72 mg/dL (ref 0–99)
Triglycerides: 307 mg/dL — ABNORMAL HIGH (ref 0–149)
VLDL Cholesterol Cal: 49 mg/dL — ABNORMAL HIGH (ref 5–40)

## 2020-04-09 ENCOUNTER — Telehealth: Payer: Self-pay

## 2020-04-09 NOTE — Telephone Encounter (Signed)
LMTCB, PEC may give result.  

## 2020-04-09 NOTE — Telephone Encounter (Signed)
-----   Message from Gabriel Cirri, NP sent at 04/08/2020  5:14 PM EDT ----- Judson Roch, it does look like you need to come in for additional labs.  I will go ahead and order them.  You can come in at any time.

## 2020-04-12 NOTE — Telephone Encounter (Signed)
Attempted to reach pt, left VM to CB per Oletha Cruel, RN PEC. Addressed in result notes.

## 2020-04-12 NOTE — Telephone Encounter (Signed)
Patient called, left VM to return the call to the office. 

## 2020-04-15 NOTE — Telephone Encounter (Signed)
Pt. Called back. Given message about labs. Will come in tomorrow.

## 2020-04-19 ENCOUNTER — Other Ambulatory Visit: Payer: Self-pay | Admitting: Urology

## 2020-04-19 ENCOUNTER — Other Ambulatory Visit: Payer: Self-pay

## 2020-04-19 ENCOUNTER — Other Ambulatory Visit: Payer: Managed Care, Other (non HMO)

## 2020-04-19 DIAGNOSIS — E291 Testicular hypofunction: Secondary | ICD-10-CM

## 2020-04-19 DIAGNOSIS — Q984 Klinefelter syndrome, unspecified: Secondary | ICD-10-CM

## 2020-04-19 DIAGNOSIS — R7309 Other abnormal glucose: Secondary | ICD-10-CM

## 2020-04-19 DIAGNOSIS — R7989 Other specified abnormal findings of blood chemistry: Secondary | ICD-10-CM

## 2020-04-19 NOTE — Telephone Encounter (Signed)
Patient called and stated that he has a new pharmacy and his prescription for his Barbaraann Cao needs to be redirected to [facility] in [place]. I have updated this in his chart.    Thanks, Marcelino Duster

## 2020-04-20 LAB — THYROID PANEL WITH TSH
Free Thyroxine Index: 1.8 (ref 1.2–4.9)
T3 Uptake Ratio: 22 % — ABNORMAL LOW (ref 24–39)
T4, Total: 8 ug/dL (ref 4.5–12.0)
TSH: 3.28 u[IU]/mL (ref 0.450–4.500)

## 2020-04-20 LAB — HGB A1C W/O EAG: Hgb A1c MFr Bld: 7.2 % — ABNORMAL HIGH (ref 4.8–5.6)

## 2020-04-22 ENCOUNTER — Other Ambulatory Visit: Payer: Self-pay | Admitting: *Deleted

## 2020-04-22 MED ORDER — XYOSTED 100 MG/0.5ML ~~LOC~~ SOAJ: 100.0000 mg | SUBCUTANEOUS | 2 refills | Status: DC

## 2020-04-29 ENCOUNTER — Other Ambulatory Visit: Payer: Self-pay | Admitting: Unknown Physician Specialty

## 2020-04-29 MED ORDER — METFORMIN HCL 500 MG PO TABS: 500.0000 mg | ORAL_TABLET | Freq: Two times a day (BID) | ORAL | 3 refills | Status: DC

## 2020-06-04 ENCOUNTER — Other Ambulatory Visit: Payer: Self-pay

## 2020-06-04 ENCOUNTER — Ambulatory Visit: Payer: Managed Care, Other (non HMO) | Admitting: Urology

## 2020-06-04 ENCOUNTER — Encounter: Payer: Self-pay | Admitting: Urology

## 2020-06-04 VITALS — BP 126/80 | HR 80 | Ht >= 80 in | Wt 346.7 lb

## 2020-06-04 DIAGNOSIS — E291 Testicular hypofunction: Secondary | ICD-10-CM

## 2020-06-04 NOTE — Progress Notes (Signed)
° °  06/04/2020 9:09 AM   Collin Butler 07/13/1985 [id number]  Referring provider: Particia Nearing, PA-C [address] [place],  [place] [address]  Chief Complaint  Patient presents with   Hypogonadism    Urologic history: 1.  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 male presents for annual follow-up.   Last labs June 2021: Testosterone 438 ng/dL; hematocrit 07.3  Doing well on Xyosted with excellent symptom improvement  No bothersome LUTS   PMH: Past Medical History:  Diagnosis Date   Asthma    Hypogonadism in male    Klinefelter syndrome    Peripheral vascular disease (HCC)    Sleep apnea     Surgical History: Past Surgical History:  Procedure Laterality Date   NO PAST SURGERIES      Home Medications:  Allergies as of 06/04/2020   No Known Allergies     Medication List       Accurate as of June 04, 2020  9:09 AM. If you have any questions, ask your nurse or doctor.        ibuprofen 200 MG tablet Commonly known as: ADVIL Take 200 mg by mouth.   levocetirizine 5 MG tablet Commonly known as: XYZAL Take 5 mg by mouth every evening.   Luer Lock Safety Syringes 21G X 1-1/2" 3 ML Misc Generic drug: SYRINGE-NEEDLE (DISP) 3 ML Use as directed for testosterone administration   metFORMIN 500 MG tablet Commonly known as: GLUCOPHAGE Take 1 tablet (500 mg total) by mouth 2 (two) times daily with a meal. Take 500 mg in the AM for 2 weeks and then increase to 500 mg BID   NEEDLE (DISP) 18 G 18G X 1" Misc Use as directed   Xyosted 100 MG/0.5ML Soaj Generic drug: Testosterone Enanthate Inject 100 mg into the skin once a week.       Allergies: No Known Allergies  Family History: Family History  Problem Relation Age of Onset   Diabetes type I Sister    Cancer Maternal Grandmother    Hypertension Maternal Grandfather    Prostate cancer Neg Hx    Bladder Cancer Neg Hx    Kidney cancer  Neg Hx     Social History:  reports that he has never smoked. He has never used smokeless tobacco. He reports current alcohol use. He reports that he does not use drugs.   Physical Exam: BP 126/80    Pulse 80    Ht 6\' 8"  (2.032 m)    Wt (!) 346 lb 11.2 oz (157.3 kg)    BMI 38.09 kg/m   Constitutional:  Alert and oriented, No acute distress. HEENT: [place] AT, moist mucus membranes.  Trachea midline, no masses. Cardiovascular: No clubbing, cyanosis, or edema. Respiratory: Normal respiratory effort, no increased work of breathing. Psychiatric: Normal mood and affect.   Assessment & Plan:    1.  Hypogonadism  Testosterone, PSA, hematocrit drawn today  Hematocrit slightly elevated 6 months ago and we discussed blood donation  If lab work stable 60-month lab visit for testosterone/hematocrit  1 year follow-up   8-month, MD  [facility] [address] [place], [place] [phone]

## 2020-06-05 LAB — HEMATOCRIT: Hematocrit: 48.4 % (ref 37.5–51.0)

## 2020-06-05 LAB — PSA: Prostate Specific Ag, Serum: 0.4 ng/mL (ref 0.0–4.0)

## 2020-06-05 LAB — TESTOSTERONE: Testosterone: 550 ng/dL (ref 264–916)

## 2020-07-21 ENCOUNTER — Other Ambulatory Visit: Payer: Self-pay | Admitting: Unknown Physician Specialty

## 2020-08-13 ENCOUNTER — Other Ambulatory Visit: Payer: Self-pay | Admitting: *Deleted

## 2020-08-13 DIAGNOSIS — Q984 Klinefelter syndrome, unspecified: Secondary | ICD-10-CM

## 2020-08-13 DIAGNOSIS — E291 Testicular hypofunction: Secondary | ICD-10-CM

## 2020-08-13 MED ORDER — XYOSTED 100 MG/0.5ML ~~LOC~~ SOAJ: 100.0000 mg | SUBCUTANEOUS | 2 refills | Status: DC

## 2020-10-18 ENCOUNTER — Other Ambulatory Visit: Payer: Self-pay | Admitting: Unknown Physician Specialty

## 2020-10-29 ENCOUNTER — Other Ambulatory Visit: Payer: Self-pay | Admitting: Family Medicine

## 2020-10-29 DIAGNOSIS — E291 Testicular hypofunction: Secondary | ICD-10-CM

## 2020-10-29 DIAGNOSIS — Q984 Klinefelter syndrome, unspecified: Secondary | ICD-10-CM

## 2020-10-29 MED ORDER — XYOSTED 100 MG/0.5ML ~~LOC~~ SOAJ: 100.0000 mg | SUBCUTANEOUS | 2 refills | Status: DC

## 2020-12-06 ENCOUNTER — Other Ambulatory Visit: Payer: Managed Care, Other (non HMO)

## 2020-12-06 ENCOUNTER — Other Ambulatory Visit: Payer: Self-pay

## 2020-12-06 DIAGNOSIS — E291 Testicular hypofunction: Secondary | ICD-10-CM

## 2020-12-07 LAB — HEMATOCRIT: Hematocrit: 48.2 % (ref 37.5–51.0)

## 2020-12-07 LAB — TESTOSTERONE: Testosterone: 370 ng/dL (ref 264–916)

## 2020-12-08 ENCOUNTER — Encounter: Payer: Self-pay | Admitting: *Deleted

## 2021-02-16 ENCOUNTER — Other Ambulatory Visit: Payer: Self-pay | Admitting: Family Medicine

## 2021-02-16 DIAGNOSIS — Q984 Klinefelter syndrome, unspecified: Secondary | ICD-10-CM

## 2021-02-16 DIAGNOSIS — E291 Testicular hypofunction: Secondary | ICD-10-CM

## 2021-02-17 MED ORDER — XYOSTED 100 MG/0.5ML ~~LOC~~ SOAJ: 100.0000 mg | SUBCUTANEOUS | 3 refills | Status: DC

## 2021-03-07 ENCOUNTER — Other Ambulatory Visit: Payer: Self-pay | Admitting: Unknown Physician Specialty

## 2021-03-08 NOTE — Telephone Encounter (Signed)
Patient needs an appointment before refill. Has not been seen in almost a year. Please call the patient to schedule.

## 2021-03-08 NOTE — Telephone Encounter (Signed)
Requested medication (s) are due for refill today: yes  Requested medication (s) are on the active medication list: yes    Last refill: 10/18/20  #180  0 refills  Future visit scheduled no  Notes to clinic:Titrated med. Please review  Requested Prescriptions  Pending Prescriptions Disp Refills   metFORMIN (GLUCOPHAGE) 500 MG tablet [Pharmacy Med Name: METFORMIN HCL 500 MG TABLET] 180 tablet 0    Sig: TAKE 1 TAB IN THE AM FOR 2 WEEKS AND THEN INCREASE TO 1 TAB TWICE A DAY     Endocrinology:  Diabetes - Biguanides Failed - 03/07/2021  1:22 AM      Failed - HBA1C is between 0 and 7.9 and within 180 days    Hgb A1c MFr Bld  Date Value Ref Range Status  04/19/2020 7.2 (H) 4.8 - 5.6 % Final    Comment:             Prediabetes: 5.7 - 6.4          Diabetes: >6.4          Glycemic control for adults with diabetes: <7.0           Failed - Valid encounter within last 6 months    Recent Outpatient Visits           11 months ago Influenza vaccine needed   [facility] Kathrine Haddock, NP   1 year ago Groin mass   [facility] Merrie Roof [place], [place]   2 years ago Sleep concern   [facility] Volney [place], [place]   3 years ago Fever, unspecified fever cause   [facility] [place], Jeannette How, MD   3 years ago Sebaceous cyst   [place], Keizer, DO       Future Appointments             In 3 months Stoioff, Ronda Fairly, MD [place] - Cr in normal range and within 360 days    Creatinine, Ser  Date Value Ref Range Status  04/01/2020 1.02 0.76 - 1.27 mg/dL Final          Passed - eGFR in normal range and within 360 days    GFR calc Af Amer  Date Value Ref Range Status  04/01/2020 110 >59 mL/min/1.73 Final    Comment:    **[facility] currently reports eGFR in compliance with the current**   recommendations of the [employer]. [facility] will    update reporting as new guidelines are published from the NKF-ASN   Task force.    GFR calc non Af Amer  Date Value Ref Range Status  04/01/2020 95 >59 mL/min/1.73 Final

## 2021-03-25 ENCOUNTER — Other Ambulatory Visit: Payer: Self-pay | Admitting: *Deleted

## 2021-03-25 DIAGNOSIS — E291 Testicular hypofunction: Secondary | ICD-10-CM

## 2021-03-25 DIAGNOSIS — Q984 Klinefelter syndrome, unspecified: Secondary | ICD-10-CM

## 2021-03-25 MED ORDER — XYOSTED 100 MG/0.5ML ~~LOC~~ SOAJ: 100.0000 mg | SUBCUTANEOUS | 3 refills | Status: DC

## 2021-04-04 NOTE — Progress Notes (Signed)
BP 128/82 (BP Location: Left Arm, Patient Position: Sitting, Cuff Size: Large)   Pulse 78   Temp 98.5 F (36.9 C) (Oral)   Resp 16   Ht 6\' 8"  (2.032 m)   Wt (!) 339 lb 6.4 oz (154 kg)   SpO2 98%   BMI 37.29 kg/m    Subjective:    Patient ID: Collin Butler, male    DOB: August 18, 1985, 35 y.o.   MRN: [id number]  HPI: Collin Butler is a 35 y.o. male presenting on 04/05/2021 for comprehensive medical examination. Current medical complaints include:none  He currently lives with: Interim Problems from his last visit: no  SLEEP APNEA Sleep apnea status: controlled Duration: months Satisfied with current treatment?:  yes CPAP use:  yes Sleep quality with CPAP use: excellent Treament compliance:excellent compliance Last sleep study:  Treatments attempted:  Wakes feeling refreshed:  yes Daytime hypersomnolence:  no Fatigue:  no Insomnia:  no Good sleep hygiene:  yes Difficulty falling asleep:  no Difficulty staying asleep:  no Snoring bothers bed partner:  yes Observed apnea by bed partner: yes Obesity:  yes Hypertension: no  Pulmonary hypertension:  no Coronary artery disease:  no   Denies HA, CP, SOB, dizziness, palpitations, visual changes, and lower extremity swelling.   Depression Screen done today and results listed below:  Depression screen [facility] 2/9 04/05/2021 04/01/2020 03/31/2019 03/27/2018 07/12/2017  Decreased Interest 0 0 0 0 0  Down, Depressed, Hopeless 0 0 0 0 0  PHQ - 2 Score 0 0 0 0 0  Altered sleeping 0 - 1 1 -  Tired, decreased energy 0 - 2 1 -  Change in appetite 0 - 0 0 -  Feeling bad or failure about yourself  0 - 1 0 -  Trouble concentrating 0 - 0 0 -  Moving slowly or fidgety/restless 0 - 0 0 -  Suicidal thoughts 0 - 0 0 -  PHQ-9 Score 0 - 4 2 -  Difficult doing work/chores Not difficult at all - - - -    The patient does not have a history of falls. I did complete a risk assessment for falls. A plan of care for falls was documented.   Past  Medical History:  Past Medical History:  Diagnosis Date   Asthma    Hypogonadism in male    Klinefelter syndrome    Peripheral vascular disease (HCC)    Sleep apnea     Surgical History:  Past Surgical History:  Procedure Laterality Date   NO PAST SURGERIES      Medications:  Current Outpatient Medications on File Prior to Visit  Medication Sig   levocetirizine (XYZAL) 5 MG tablet Take 5 mg by mouth every evening.   metFORMIN (GLUCOPHAGE) 500 MG tablet TAKE 1 TAB IN THE AM FOR 2 WEEKS AND THEN INCREASE TO 1 TAB TWICE A DAY   NEEDLE, DISP, 18 G 18G X 1" MISC Use as directed   SYRINGE-NEEDLE, DISP, 3 ML (LUER LOCK SAFETY SYRINGES) 21G X 1-1/2" 3 ML MISC Use as directed for testosterone administration   XYOSTED 100 MG/0.5ML SOAJ Inject 100 mg into the skin once a week.   ibuprofen (ADVIL,MOTRIN) 200 MG tablet Take 200 mg by mouth. (Patient not taking: No sig reported)   No current facility-administered medications on file prior to visit.    Allergies:  No Known Allergies  Social History:  Social History   Socioeconomic History   Marital status: Married    Spouse name: Not on  file   Number of children: Not on file   Years of education: Not on file   Highest education level: Not on file  Occupational History   Not on file  Tobacco Use   Smoking status: Never   Smokeless tobacco: Never  Vaping Use   Vaping Use: Never used  Substance and Sexual Activity   Alcohol use: Yes    Comment: rare   Drug use: No   Sexual activity: Yes    Birth control/protection: None  Other Topics Concern   Not on file  Social History Narrative   Not on file   Social Determinants of Health   Financial Resource Strain: Not on file  Food Insecurity: Not on file  Transportation Needs: Not on file  Physical Activity: Not on file  Stress: Not on file  Social Connections: Not on file  Intimate Partner Violence: Not on file   Social History   Tobacco Use  Smoking Status Never   Smokeless Tobacco Never   Social History   Substance and Sexual Activity  Alcohol Use Yes   Comment: rare    Family History:  Family History  Problem Relation Age of Onset   Diabetes type I Sister    Cancer Maternal Grandmother    Hypertension Maternal Grandfather    Prostate cancer Neg Hx    Bladder Cancer Neg Hx    Kidney cancer Neg Hx     Past medical history, surgical history, medications, allergies, family history and social history reviewed with patient today and changes made to appropriate areas of the chart.   Review of Systems  Eyes:  Negative for blurred vision and double vision.  Respiratory:  Negative for shortness of breath.   Cardiovascular:  Negative for chest pain, palpitations and leg swelling.  Neurological:  Negative for dizziness and headaches.  All other ROS negative except what is listed above and in the HPI.      Objective:    BP 128/82 (BP Location: Left Arm, Patient Position: Sitting, Cuff Size: Large)   Pulse 78   Temp 98.5 F (36.9 C) (Oral)   Resp 16   Ht 6\' 8"  (2.032 m)   Wt (!) 339 lb 6.4 oz (154 kg)   SpO2 98%   BMI 37.29 kg/m   Wt Readings from Last 3 Encounters:  04/05/21 (!) 339 lb 6.4 oz (154 kg)  06/04/20 (!) 346 lb 11.2 oz (157.3 kg)  04/01/20 (!) 355 lb (161 kg)    Physical Exam Vitals and nursing note reviewed.  Constitutional:      General: He is not in acute distress.    Appearance: Normal appearance. He is not ill-appearing, toxic-appearing or diaphoretic.  HENT:     Head: Normocephalic.     Right Ear: Tympanic membrane, ear canal and external ear normal.     Left Ear: Tympanic membrane, ear canal and external ear normal.     Nose: Nose normal. No congestion or rhinorrhea.     Mouth/Throat:     Mouth: Mucous membranes are moist.  Eyes:     General:        Right eye: No discharge.        Left eye: No discharge.     Extraocular Movements: Extraocular movements intact.     Conjunctiva/sclera: Conjunctivae normal.      Pupils: Pupils are equal, round, and reactive to light.  Cardiovascular:     Rate and Rhythm: Normal rate and regular rhythm.     Heart  sounds: No murmur heard. Pulmonary:     Effort: Pulmonary effort is normal. No respiratory distress.     Breath sounds: Normal breath sounds. No wheezing, rhonchi or rales.  Abdominal:     General: Abdomen is flat. Bowel sounds are normal. There is no distension.     Palpations: Abdomen is soft.     Tenderness: There is no abdominal tenderness. There is no guarding.  Musculoskeletal:     Cervical back: Normal range of motion and neck supple.  Skin:    General: Skin is warm and dry.     Capillary Refill: Capillary refill takes less than 2 seconds.  Neurological:     General: No focal deficit present.     Mental Status: He is alert and oriented to person, place, and time.     Cranial Nerves: No cranial nerve deficit.     Motor: No weakness.     Deep Tendon Reflexes: Reflexes normal.  Psychiatric:        Mood and Affect: Mood normal.        Behavior: Behavior normal.        Thought Content: Thought content normal.        Judgment: Judgment normal.    Results for orders placed or performed in visit on 12/06/20  Hematocrit  Result Value Ref Range   Hematocrit 48.2 37.5 - 51.0 %  Testosterone  Result Value Ref Range   Testosterone 370 264 - 916 ng/dL      Assessment & Plan:   Problem List Items Addressed This Visit       Respiratory   Sleep apnea    Chronic. Well controlled.  Patient endorses 100% use of CPAP. Continue with CPAP nightly and for naps.        Other Visit Diagnoses     Annual physical exam    -  Primary   Health maintenance reviewed during visit.  Labs ordered. Flu shot given. Declined HPV.   Relevant Orders   TSH   Lipid panel   CBC with Differential/Platelet   Comprehensive metabolic panel   Urinalysis, Routine w reflex microscopic   Need for influenza vaccination       Relevant Orders   Flu Vaccine QUAD 6+  mos PF IM (Fluarix Quad PF)        Discussed aspirin prophylaxis for myocardial infarction prevention and decision was it was not indicated  LABORATORY TESTING:  Health maintenance labs ordered today as discussed above.   The natural history of prostate cancer and ongoing controversy regarding screening and potential treatment outcomes of prostate cancer has been discussed with the patient. The meaning of a false positive PSA and a false negative PSA has been discussed. He indicates understanding of the limitations of this screening test and wishes not to proceed with screening PSA testing.   IMMUNIZATIONS:   - Tdap: Tetanus vaccination status reviewed: last tetanus booster within 10 years. - Influenza: Administered today - Pneumovax: Not applicable - Prevnar: Not applicable - HPV:  Discussed at visit today.  - Zostavax vaccine: Not applicable  SCREENING: - Colonoscopy: Not applicable  Discussed with patient purpose of the colonoscopy is to detect colon cancer at curable precancerous or early stages   - AAA Screening: Not applicable  -Hearing Test: Not applicable  -Spirometry: Not applicable   PATIENT COUNSELING:    Sexuality: Discussed sexually transmitted diseases, partner selection, use of condoms, avoidance of unintended pregnancy  and contraceptive alternatives.   Advised to avoid cigarette smoking.  I discussed with the patient that most people either abstain from alcohol or drink within safe limits (<=14/week and <=4 drinks/occasion for males, <=7/weeks and <= 3 drinks/occasion for females) and that the risk for alcohol disorders and other health effects rises proportionally with the number of drinks per week and how often a drinker exceeds daily limits.  Discussed cessation/primary prevention of drug use and availability of treatment for abuse.   Diet: Encouraged to adjust caloric intake to maintain  or achieve ideal body weight, to reduce intake of dietary saturated  fat and total fat, to limit sodium intake by avoiding high sodium foods and not adding table salt, and to maintain adequate dietary potassium and calcium preferably from fresh fruits, vegetables, and low-fat dairy products.    stressed the importance of regular exercise  Injury prevention: Discussed safety belts, safety helmets, smoke detector, smoking near bedding or upholstery.   Dental health: Discussed importance of regular tooth brushing, flossing, and dental visits.   Follow up plan: NEXT PREVENTATIVE PHYSICAL DUE IN 1 YEAR. Return in about 1 year (around 04/05/2022) for Physical and Fasting labs.

## 2021-04-05 ENCOUNTER — Ambulatory Visit (INDEPENDENT_AMBULATORY_CARE_PROVIDER_SITE_OTHER): Payer: Managed Care, Other (non HMO) | Admitting: Nurse Practitioner

## 2021-04-05 ENCOUNTER — Other Ambulatory Visit: Payer: Self-pay

## 2021-04-05 ENCOUNTER — Encounter: Payer: Self-pay | Admitting: Nurse Practitioner

## 2021-04-05 VITALS — BP 128/82 | HR 78 | Temp 98.5°F | Resp 16 | Ht >= 80 in | Wt 339.4 lb

## 2021-04-05 DIAGNOSIS — R7309 Other abnormal glucose: Secondary | ICD-10-CM | POA: Diagnosis not present

## 2021-04-05 DIAGNOSIS — E781 Pure hyperglyceridemia: Secondary | ICD-10-CM

## 2021-04-05 DIAGNOSIS — G4733 Obstructive sleep apnea (adult) (pediatric): Secondary | ICD-10-CM

## 2021-04-05 DIAGNOSIS — Z0000 Encounter for general adult medical examination without abnormal findings: Secondary | ICD-10-CM

## 2021-04-05 DIAGNOSIS — R748 Abnormal levels of other serum enzymes: Secondary | ICD-10-CM

## 2021-04-05 DIAGNOSIS — Z23 Encounter for immunization: Secondary | ICD-10-CM

## 2021-04-05 LAB — URINALYSIS, ROUTINE W REFLEX MICROSCOPIC
Bilirubin, UA: NEGATIVE
Glucose, UA: NEGATIVE
Ketones, UA: NEGATIVE
Leukocytes,UA: NEGATIVE
Nitrite, UA: NEGATIVE
RBC, UA: NEGATIVE
Specific Gravity, UA: >1.03 — ABNORMAL HIGH (ref 1.005–1.030)
Urobilinogen, Ur: 0.2 mg/dL (ref 0.2–1.0)
pH, UA: 5.5 (ref 5.0–7.5)

## 2021-04-05 LAB — MICROSCOPIC EXAMINATION
Bacteria, UA: NONE SEEN
Epithelial Cells (non renal): NONE SEEN /hpf (ref 0–10)
RBC, Urine: NONE SEEN /hpf (ref 0–2)
WBC, UA: NONE SEEN /hpf (ref 0–5)

## 2021-04-05 NOTE — Assessment & Plan Note (Signed)
Chronic. Well controlled.  Patient endorses 100% use of CPAP. Continue with CPAP nightly and for naps.

## 2021-04-06 ENCOUNTER — Telehealth: Payer: Self-pay | Admitting: Family Medicine

## 2021-04-06 LAB — CBC WITH DIFFERENTIAL/PLATELET
Basophils Absolute: 0.1 10*3/uL (ref 0.0–0.2)
Basos: 2 %
EOS (ABSOLUTE): 0.2 10*3/uL (ref 0.0–0.4)
Eos: 3 %
Hematocrit: 48.2 % (ref 37.5–51.0)
Hemoglobin: 15.1 g/dL (ref 13.0–17.7)
Immature Grans (Abs): 0 10*3/uL (ref 0.0–0.1)
Immature Granulocytes: 0 %
Lymphocytes Absolute: 2.4 10*3/uL (ref 0.7–3.1)
Lymphs: 28 %
MCH: 25.5 pg — ABNORMAL LOW (ref 26.6–33.0)
MCHC: 31.3 g/dL — ABNORMAL LOW (ref 31.5–35.7)
MCV: 82 fL (ref 79–97)
Monocytes Absolute: 0.7 10*3/uL (ref 0.1–0.9)
Monocytes: 8 %
Neutrophils Absolute: 5 10*3/uL (ref 1.4–7.0)
Neutrophils: 59 %
Platelets: 294 10*3/uL (ref 150–450)
RBC: 5.91 x10E6/uL — ABNORMAL HIGH (ref 4.14–5.80)
RDW: 15.1 % (ref 11.6–15.4)
WBC: 8.5 10*3/uL (ref 3.4–10.8)

## 2021-04-06 LAB — LIPID PANEL
Chol/HDL Ratio: 4.6 ratio (ref 0.0–5.0)
Cholesterol, Total: 164 mg/dL (ref 100–199)
HDL: 36 mg/dL — ABNORMAL LOW (ref >39)
LDL Chol Calc (NIH): 75 mg/dL (ref 0–99)
Triglycerides: 330 mg/dL — ABNORMAL HIGH (ref 0–149)
VLDL Cholesterol Cal: 53 mg/dL — ABNORMAL HIGH (ref 5–40)

## 2021-04-06 LAB — COMPREHENSIVE METABOLIC PANEL
ALT: 112 IU/L — ABNORMAL HIGH (ref 0–44)
AST: 72 IU/L — ABNORMAL HIGH (ref 0–40)
Albumin/Globulin Ratio: 1.3 (ref 1.2–2.2)
Albumin: 4.3 g/dL (ref 4.0–5.0)
Alkaline Phosphatase: 91 IU/L (ref 44–121)
BUN/Creatinine Ratio: 13 (ref 9–20)
BUN: 13 mg/dL (ref 6–20)
Bilirubin Total: 0.4 mg/dL (ref 0.0–1.2)
CO2: 22 mmol/L (ref 20–29)
Calcium: 9.9 mg/dL (ref 8.7–10.2)
Chloride: 97 mmol/L (ref 96–106)
Creatinine, Ser: 0.99 mg/dL (ref 0.76–1.27)
Globulin, Total: 3.3 g/dL (ref 1.5–4.5)
Glucose: 111 mg/dL — ABNORMAL HIGH (ref 70–99)
Potassium: 4.2 mmol/L (ref 3.5–5.2)
Sodium: 138 mmol/L (ref 134–144)
Total Protein: 7.6 g/dL (ref 6.0–8.5)
eGFR: 102 mL/min/{1.73_m2} (ref >59)

## 2021-04-06 LAB — TSH: TSH: 3.58 u[IU]/mL (ref 0.450–4.500)

## 2021-04-06 NOTE — Progress Notes (Signed)
Hi Collin Butler.  It was nice to meet you yesterday.  Your thyroid was normal.  Your triglycerides were significantly elevated. Your glucose was also elevated.  Were you fasting yesterday? If so, we should consider checking an A1c to evaluate for diabetes and starting Fenofibrate to help with the triglycerides. Your Liver enzymes are also elevated.  I recommend we get an ultrasound of the liver to evaluate the cause of the elevated Liver Enzymes.  I recommend avoiding tylenol and alcohol.  Please let me know if you have any questions.

## 2021-04-06 NOTE — Progress Notes (Signed)
Since patient wasn't fasting we will repeat his cholesterol with his A1c to see if the triglycerides improve.  If they do then we don't need to add the medication.  If not, we will decide to add it.  Please make patient a lab appointment.  I have added the order for the labs and the ultrasound.

## 2021-04-06 NOTE — Addendum Note (Signed)
Addended by: Larae Grooms on: 04/06/2021 01:47 PM   Modules accepted: Orders

## 2021-04-06 NOTE — Telephone Encounter (Signed)
Collin Butler has been approved  04/01/2021-04/05/2022

## 2021-04-08 ENCOUNTER — Other Ambulatory Visit: Payer: Managed Care, Other (non HMO)

## 2021-04-08 ENCOUNTER — Other Ambulatory Visit: Payer: Self-pay

## 2021-04-08 ENCOUNTER — Other Ambulatory Visit: Payer: Self-pay | Admitting: Nurse Practitioner

## 2021-04-08 DIAGNOSIS — R7309 Other abnormal glucose: Secondary | ICD-10-CM

## 2021-04-08 DIAGNOSIS — E781 Pure hyperglyceridemia: Secondary | ICD-10-CM

## 2021-04-09 LAB — LIPID PANEL
Chol/HDL Ratio: 4.5 ratio (ref 0.0–5.0)
Cholesterol, Total: 149 mg/dL (ref 100–199)
HDL: 33 mg/dL — ABNORMAL LOW (ref >39)
LDL Chol Calc (NIH): 67 mg/dL (ref 0–99)
Triglycerides: 308 mg/dL — ABNORMAL HIGH (ref 0–149)
VLDL Cholesterol Cal: 49 mg/dL — ABNORMAL HIGH (ref 5–40)

## 2021-04-09 LAB — HEMOGLOBIN A1C
Est. average glucose Bld gHb Est-mCnc: 131 mg/dL
Hgb A1c MFr Bld: 6.2 % — ABNORMAL HIGH (ref 4.8–5.6)

## 2021-04-11 ENCOUNTER — Telehealth: Payer: Self-pay

## 2021-04-11 DIAGNOSIS — R7309 Other abnormal glucose: Secondary | ICD-10-CM

## 2021-04-11 DIAGNOSIS — E781 Pure hyperglyceridemia: Secondary | ICD-10-CM

## 2021-04-11 MED ORDER — FENOFIBRATE 145 MG PO TABS
145.0000 mg | ORAL_TABLET | Freq: Every day | ORAL | 1 refills | Status: DC
Start: 2021-04-11 — End: 2021-10-05

## 2021-04-11 NOTE — Progress Notes (Signed)
Please let patient know his triglycerides are still elevated.  I recommend her start the fenofibrate so I have sent that to the pharmacy for him.  The blood work also shows that his A1c is in the prediabetic range.  Continue to work on diet and exercise to help with this.  Please let me know if he has any questions.  He should return to the clinic for a visit in 6 months so we can recheck his labs. Please make him an office visit.

## 2021-04-11 NOTE — Telephone Encounter (Signed)
-----   Message from Larae Grooms, NP sent at 04/11/2021  8:30 AM EDT ----- Please let patient know his triglycerides are still elevated.  I recommend her start the fenofibrate so I have sent that to the pharmacy for him.  The blood work also shows that his A1c is in the prediabetic range.  Continue to work on diet and exercise to help with this.  Please let me know if he has any questions.  He should return to the clinic for a visit in 6 months so we can recheck his labs. Please make him an office visit.

## 2021-04-11 NOTE — Addendum Note (Signed)
Addended by: Larae Grooms on: 04/11/2021 08:30 AM   Modules accepted: Orders

## 2021-04-11 NOTE — Telephone Encounter (Signed)
Patient aware of results and recommendations.  Referral to nutritionist per patient request.

## 2021-04-13 ENCOUNTER — Other Ambulatory Visit: Payer: Self-pay

## 2021-04-13 ENCOUNTER — Encounter: Payer: Self-pay | Admitting: Dietician

## 2021-04-13 ENCOUNTER — Encounter: Payer: Managed Care, Other (non HMO) | Attending: Nurse Practitioner | Admitting: Dietician

## 2021-04-13 VITALS — Ht >= 80 in | Wt 341.2 lb

## 2021-04-13 DIAGNOSIS — E782 Mixed hyperlipidemia: Secondary | ICD-10-CM | POA: Insufficient documentation

## 2021-04-13 NOTE — Progress Notes (Signed)
Medical Nutrition Therapy  Appointment Start time:  1530  Appointment End time:  1630  Primary concerns today: Prediabetes,   Referral diagnosis: E78.1 - Hypertriglyceridemia, R73.09 - Elevated Glucose Preferred learning style: No preference indicated Learning readiness: Ready   NUTRITION ASSESSMENT   Anthropometrics  Ht: 6'8" Wt: 341.2 lbs  Body mass index is 37.48 kg/m.   Clinical Medical Hx: Prediabetes, HLD, Klinefelter's syndrome. 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 Pt reports being sedentary while at home. Pt reports history of weight gain over the pandemic. [facility]. Pt will ride recumbent bike, lift weights, and occasionally walk the treadmill. Workouts usually last around 30 minutes and are low intensity. Pt reports liking to eat raw vegetables. Pt used to drink soda and lemonade but has cut back. Pt has been drinking [place] protein drinks daily. Pt reports drinking 200 oz of water a day. Pt will be getting an abdominal ultrasound next week to check for fatty liver due to continually elevated triglycerides.  Estimated daily fluid intake: ~200 oz Supplements: N/A Sleep: OSA, uses CPAP. Sleeps well, ~8 hours Stress / self-care: Low Current average weekly physical activity: ADLS, gym 3x a week  24-Hr Dietary Recall First Meal: [place] Protein shake Snack: none Second Meal: [employer] SW chicken salad wrap, Sunkist, Double Shot Espresso drink Snack: none Third Meal: Center cut pork chops, raw broccoli, sauteed onions. Snack: none Beverages: water,    NUTRITION DIAGNOSIS  NB-1.1 Food and nutrition-related knowledge deficit As related to Hypertriglyceridemia.  As evidenced by serum triglycerides of 308  mg/dL, dietary recall high in concentrated sugars, and SSB.Marland Kitchen   NUTRITION INTERVENTION  Nutrition education (E-1) on the following topics:  Educate pt on the difference between LDL and HDL cholesterol. Educate pt on the factors that can increase and decrease HDL cholesterol. Including exercise to increase HDL, and tobacco use to decrease HDL. Educate pt on factors that can elevate LDL cholesterol, including high dietary intake of saturated fats. Educate pt on identifying sources of saturated fats, and how to make alternative food choices to lower saturated fat intake. Educate pt on the role of soluble fiber in binding to cholesterol in the GI tract an eliminating it from the body. Educate pt on dietary sources of soluble fiber. Educate pt on the potential dietary causes of hypertriglyceridemia.Educate on the role of elevated LDL,total cholesterol, and triglycerides on cardiovascular health. Educate pt on the role of physical activity in lowering LDL and increasing HDL cholesterol. Educated patient on the pathophysiology of diabetes. This includes why our bodies need circulating blood sugar, the relationship between insulin and blood sugar, and the results of insulin resistance and/or pancreatic insufficiency on the development of diabetes. Educated patient on factors that contribute to elevation of blood sugars, such as stress, illness, injury,and food choices. Discussed the role that physical activity plays in lowering blood sugar. Educate patient on the three main macronutrients. Protein, fats, and carbohydrates. Discussed how each of these macronutrients affect blood sugar levels, especially carbohydrate, and the importance of eating a consistent amount of carbohydrate throughout the day.   Handouts Provided Include  [employer] Allergy Food List High Triglyceride Nutrition Therapy Nutrition Care Manual NAFLD Guidelines  Learning Style & Readiness for Change Teaching method utilized: Visual & Auditory  Demonstrated degree of understanding via: Teach Back  Barriers to learning/adherence to lifestyle change: None  Goals Established by Pt Consider switching to Premier protein, Ensure MAX, Boost Plus, or Glucerna  protein supplements in place of the [employer] drink. Have one for breakfast to start your day. When exercising work to get your heart rate up quickly, and maintain that intensity for the duration of your workout. Begin to lower your consumption of sugar sweetened beverages. Choose low or reduced fat dairy and lean meats to reduce saturated fat! Aim for a rate of weight loss of 2 pounds per week. Remember, baby steps!   MONITORING & EVALUATION Dietary intake, weekly physical activity, and SSB intake in 2 months.  Next Steps  Patient is to follow up with RD.

## 2021-04-13 NOTE — Patient Instructions (Addendum)
Consider switching to Premier protein, Ensure MAX, Boost Plus, or Glucerna  protein supplements in place of the [employer] drink. Have one for breakfast to start your day.  When exercising work to get your heart rate up quickly, and maintain that intensity for the duration of your workout.  Begin to lower your consumption of sugar sweetened beverages.  Choose low or reduced fat dairy and lean meats to reduce saturated fat!  Aim for a rate of weight loss of 2 pounds per week. Remember, baby steps!

## 2021-04-18 ENCOUNTER — Ambulatory Visit
Admission: RE | Admit: 2021-04-18 | Discharge: 2021-04-18 | Disposition: A | Discharge status: Home / Self Care | Payer: Managed Care, Other (non HMO) | Source: Ambulatory Visit | Attending: Nurse Practitioner | Admitting: Nurse Practitioner

## 2021-04-18 ENCOUNTER — Other Ambulatory Visit: Payer: Self-pay

## 2021-04-18 DIAGNOSIS — R748 Abnormal levels of other serum enzymes: Secondary | ICD-10-CM | POA: Insufficient documentation

## 2021-04-20 NOTE — Progress Notes (Signed)
Hi Collin Butler.  Your abdominal ultrasound shows that you have fatty liver.  This is the cause of the elevated liver enzymes. At this point, I recommend a low fat diet.  We will continue to monitor in the future.  Please let me know if you have any questions.

## 2021-06-09 ENCOUNTER — Encounter: Payer: Self-pay | Admitting: Urology

## 2021-06-09 ENCOUNTER — Ambulatory Visit: Payer: Managed Care, Other (non HMO) | Admitting: Urology

## 2021-06-09 ENCOUNTER — Other Ambulatory Visit: Payer: Self-pay

## 2021-06-09 VITALS — BP 123/86 | HR 79 | Ht >= 80 in | Wt 329.0 lb

## 2021-06-09 DIAGNOSIS — E291 Testicular hypofunction: Secondary | ICD-10-CM | POA: Diagnosis not present

## 2021-06-09 NOTE — Progress Notes (Signed)
   06/09/2021 8:30 AM   Collin Butler 10-May-1986 [id number]  Referring provider: Particia Nearing, PA-C [address] [place],  [place] [address]  Chief Complaint  Patient presents with   Hypogonadism    Urologic history: 1.  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 male presents for annual follow-up.  Doing well since last visit No bothersome LUTS Denies dysuria, gross hematuria Denies flank, abdominal or pelvic pain 51-month interim labs 12/2020: Testosterone 370, hematocrit 48.2  PMH: Past Medical History:  Diagnosis Date   Asthma    Hypogonadism in male    Klinefelter syndrome    Peripheral vascular disease (HCC)    Sleep apnea     Surgical History: Past Surgical History:  Procedure Laterality Date   NO PAST SURGERIES      Home Medications:  Allergies as of 06/09/2021       Reactions   Avocado Itching   Banana Itching        Medication List        Accurate as of June 09, 2021  8:30 AM. If you have any questions, ask your nurse or doctor.          fenofibrate 145 MG tablet Commonly known as: Tricor Take 1 tablet (145 mg total) by mouth daily.   ibuprofen 200 MG tablet Commonly known as: ADVIL Take 200 mg by mouth.   levocetirizine 5 MG tablet Commonly known as: XYZAL Take 5 mg by mouth every evening.   Luer Lock Safety Syringes 21G X 1-1/2" 3 ML Misc Generic drug: SYRINGE-NEEDLE (DISP) 3 ML Use as directed for testosterone administration   metFORMIN 500 MG tablet Commonly known as: GLUCOPHAGE TAKE 1 TAB IN THE AM FOR 2 WEEKS AND THEN INCREASE TO 1 TAB TWICE A DAY   NEEDLE (DISP) 18 G 18G X 1" Misc Use as directed   Xyosted 100 MG/0.5ML Soaj Generic drug: Testosterone Enanthate Inject 100 mg into the skin once a week.        Allergies:  Allergies  Allergen Reactions   Avocado Itching   Banana Itching    Family History: Family History  Problem Relation Age of Onset    Diabetes type I Sister    Cancer Maternal Grandmother    Hypertension Maternal Grandfather    Prostate cancer Neg Hx    Bladder Cancer Neg Hx    Kidney cancer Neg Hx     Social History:  reports that he has never smoked. He has never used smokeless tobacco. He reports current alcohol use. He reports that he does not use drugs.   Physical Exam: BP 123/86   Pulse 79   Ht 6\' 8"  (2.032 m)   Wt (!) 329 lb (149.2 kg)   BMI 36.14 kg/m   Constitutional:  Alert and oriented, No acute distress. HEENT: [place] AT, moist mucus membranes.  Trachea midline, no masses. Cardiovascular: No clubbing, cyanosis, or edema. Respiratory: Normal respiratory effort, no increased work of breathing. Psychiatric: Normal mood and affect.   Assessment & Plan:    1.  Hypogonadism Stable symptoms on TRT Testosterone, hematocrit today Lab visit 6 months for testosterone, hematocrit Continue annual follow-up   , MD  [facility] [address], [address] [place], [place] [phone]

## 2021-06-10 ENCOUNTER — Encounter: Payer: Self-pay | Admitting: *Deleted

## 2021-06-10 LAB — TESTOSTERONE: Testosterone: 324 ng/dL (ref 264–916)

## 2021-06-10 LAB — HEMATOCRIT: Hematocrit: 47 % (ref 37.5–51.0)

## 2021-06-13 ENCOUNTER — Encounter: Payer: Managed Care, Other (non HMO) | Attending: Nurse Practitioner | Admitting: Dietician

## 2021-06-13 ENCOUNTER — Other Ambulatory Visit: Payer: Self-pay

## 2021-06-13 ENCOUNTER — Encounter: Payer: Self-pay | Admitting: Dietician

## 2021-06-13 VITALS — Ht >= 80 in | Wt 335.6 lb

## 2021-06-13 DIAGNOSIS — E782 Mixed hyperlipidemia: Secondary | ICD-10-CM | POA: Diagnosis not present

## 2021-06-13 NOTE — Patient Instructions (Addendum)
Keep up the great work limiting your sugar sweetened beverages!  Work on finding time to increase your activity level throughout the week. Consider finding a field near you to throw some discs during the week.  Keep up the great work with your dietary changes!  Contact your PCP to schedule an A1c and lipid profile in early January.

## 2021-06-13 NOTE — Progress Notes (Signed)
Medical Nutrition Therapy  Appointment Start time:  1530  Appointment End time:  1610  Primary concerns today: Prediabetes,   Referral diagnosis: E78.1 - Hypertriglyceridemia, R73.09 - Elevated Glucose Preferred learning style: No preference indicated Learning readiness: Ready   NUTRITION ASSESSMENT   Anthropometrics  Ht: 6'8" Wt: 335.6 lbs  Body mass index is 36.87 kg/m.   Clinical Medical Hx: Prediabetes, HLD, Klinefelter's syndrome, NEW: Fatty Liver Medications: Metformin, Fenofibrate, Xyzal Labs: TGL - 308, A1c - 6.2 Notable Signs/Symptoms: Diarrhea, Central Adiposity    Lifestyle & Dietary Hx Pt reports getting an ultrasound that indicated they have a fatty liver.  Pt reports they are getting renovations to their house that is making ADLs more difficult at home. Pt states this will be done in the next month. Pt reports removing a lot of sugar from their diet, especially sugar sweetened beverages. Pt is drinking mostly water now. Pt has switched to Premier protein shakes for breakfast, may have 1-2 depending on their appetite. Pt has begun a daily MVI.  Pt reports a decline in exercise due to wife's busy schedule. Pt is sedentary mostly during the week, will play disc golf on the weekends.    Estimated daily fluid intake: ~200 oz Supplements: NEW: Men's One-A-Day Sleep: OSA, uses CPAP. Sleeps well, ~8 hours Stress / self-care: Low Current average weekly physical activity: ADLS, gym 3x a week   24-Hr Dietary Recall First Meal: Protein Shake Snack: none Second Meal: None Snack: none Third Meal: Pulled pork, pasta, BBQ sauce, bowl of raw broccoli Snack: none Beverages: water, coffee with whole milk   NUTRITION DIAGNOSIS  NB-1.1 Food and nutrition-related knowledge deficit As related to Hypertriglyceridemia.  As evidenced by serum triglycerides of 308 mg/dL, dietary recall high in concentrated sugars, and SSB.Marland Kitchen   NUTRITION INTERVENTION  Nutrition education (E-1)  on the following topics:  Educate pt on the difference between LDL and HDL cholesterol. Educate pt on the factors that can increase and decrease HDL cholesterol. Including exercise to increase HDL, and tobacco use to decrease HDL. Educate pt on factors that can elevate LDL cholesterol, including high dietary intake of saturated fats. Educate pt on identifying sources of saturated fats, and how to make alternative food choices to lower saturated fat intake. Educate pt on the role of soluble fiber in binding to cholesterol in the GI tract an eliminating it from the body. Educate pt on dietary sources of soluble fiber. Educate pt on the potential dietary causes of hypertriglyceridemia.Educate on the role of elevated LDL,total cholesterol, and triglycerides on cardiovascular health. Educate pt on the role of physical activity in lowering LDL and increasing HDL cholesterol. Educated patient on the pathophysiology of diabetes. This includes why our bodies need circulating blood sugar, the relationship between insulin and blood sugar, and the results of insulin resistance and/or pancreatic insufficiency on the development of diabetes. Educated patient on factors that contribute to elevation of blood sugars, such as stress, illness, injury,and food choices. Discussed the role that physical activity plays in lowering blood sugar. Educate patient on the three main macronutrients. Protein, fats, and carbohydrates. Discussed how each of these macronutrients affect blood sugar levels, especially carbohydrate, and the importance of eating a consistent amount of carbohydrate throughout the day.   Handouts Provided Include  Latex Allergy Food List High Triglyceride Nutrition Therapy Nutrition Care Manual NAFLD Guidelines  Learning Style & Readiness for Change Teaching method utilized: Visual & Auditory  Demonstrated degree of understanding via: Teach Back  Barriers to learning/adherence  to lifestyle change:  None  Goals Established by Pt Keep up the great work limiting your sugar sweetened beverages! Work on finding time to increase your activity level throughout the week. Consider finding a field near you to throw some discs during the week. Keep up the great work with your dietary changes! Contact your PCP to schedule an A1c and lipid profile in early January.   MONITORING & EVALUATION Dietary intake, weekly physical activity, and SSB intake in 2 months.  Next Steps  Patient is to follow up with RD.

## 2021-07-21 ENCOUNTER — Other Ambulatory Visit: Payer: Self-pay | Admitting: Family Medicine

## 2021-07-21 DIAGNOSIS — E291 Testicular hypofunction: Secondary | ICD-10-CM

## 2021-07-21 DIAGNOSIS — Q984 Klinefelter syndrome, unspecified: Secondary | ICD-10-CM

## 2021-07-22 MED ORDER — XYOSTED 100 MG/0.5ML ~~LOC~~ SOAJ: 100.0000 mg | SUBCUTANEOUS | 3 refills | Status: DC

## 2021-07-25 ENCOUNTER — Other Ambulatory Visit: Payer: Self-pay

## 2021-07-25 ENCOUNTER — Other Ambulatory Visit: Payer: Managed Care, Other (non HMO)

## 2021-07-25 ENCOUNTER — Telehealth: Payer: Self-pay | Admitting: Nurse Practitioner

## 2021-07-25 DIAGNOSIS — E781 Pure hyperglyceridemia: Secondary | ICD-10-CM

## 2021-07-25 DIAGNOSIS — R7309 Other abnormal glucose: Secondary | ICD-10-CM

## 2021-07-25 NOTE — Telephone Encounter (Signed)
Patient states his nutritionist is requesting up to date labs.

## 2021-07-25 NOTE — Telephone Encounter (Signed)
Pt came in office for lab visit but was advised there are no labs ordered by a physician at [facility] and the labs ordered would need to be collected at another [facility].  Patient states that his nutritionist advised to have his PCP order an A1C and a lipid panel.  Please advise if orders can be entered or does pt need an appointment prior.  Pt can be reached at [phone].

## 2021-07-25 NOTE — Telephone Encounter (Signed)
Patient had labs done in October that included an A1c and Lipid panel.  Does he need these again?

## 2021-07-26 NOTE — Telephone Encounter (Signed)
Called patient LVM advising patient labs have been ordered.

## 2021-07-28 ENCOUNTER — Other Ambulatory Visit: Payer: Managed Care, Other (non HMO)

## 2021-07-28 ENCOUNTER — Other Ambulatory Visit: Payer: Self-pay

## 2021-07-28 DIAGNOSIS — R7309 Other abnormal glucose: Secondary | ICD-10-CM

## 2021-07-28 DIAGNOSIS — E781 Pure hyperglyceridemia: Secondary | ICD-10-CM

## 2021-07-29 LAB — HEMOGLOBIN A1C
Est. average glucose Bld gHb Est-mCnc: 123 mg/dL
Hgb A1c MFr Bld: 5.9 % — ABNORMAL HIGH (ref 4.8–5.6)

## 2021-07-29 LAB — LIPID PANEL
Chol/HDL Ratio: 3 ratio (ref 0.0–5.0)
Cholesterol, Total: 114 mg/dL (ref 100–199)
HDL: 38 mg/dL — ABNORMAL LOW (ref >39)
LDL Chol Calc (NIH): 53 mg/dL (ref 0–99)
Triglycerides: 128 mg/dL (ref 0–149)
VLDL Cholesterol Cal: 23 mg/dL (ref 5–40)

## 2021-07-29 NOTE — Progress Notes (Signed)
Hi Collin Butler.  Please review your labs below and follow up with your nutritionist.

## 2021-08-09 ENCOUNTER — Other Ambulatory Visit: Payer: Self-pay

## 2021-08-09 ENCOUNTER — Encounter: Payer: Self-pay | Admitting: Dietician

## 2021-08-09 ENCOUNTER — Encounter: Payer: Managed Care, Other (non HMO) | Attending: Nurse Practitioner | Admitting: Dietician

## 2021-08-09 VITALS — Ht >= 80 in | Wt 334.1 lb

## 2021-08-09 DIAGNOSIS — R7303 Prediabetes: Secondary | ICD-10-CM | POA: Diagnosis not present

## 2021-08-09 NOTE — Patient Instructions (Addendum)
Congratulations on your lab values!! The hard work is paying off, these numbers don't lie.  Look into "Carb Counter" tortillas for a high fiber, low calorie option.  Keep up the great work being more active!

## 2021-08-09 NOTE — Progress Notes (Signed)
Medical Nutrition Therapy  Appointment Start time:  [id number]  Appointment End time:  1610  Primary concerns today: Prediabetes, Cholesterol Referral diagnosis: E78.1 - Hypertriglyceridemia, R73.09 - Elevated Glucose Preferred learning style: No preference indicated Learning readiness: Ready   NUTRITION ASSESSMENT   Anthropometrics  Ht: 6'8" Wt: 334.1 lbs  Body mass index is 36.7 kg/m.   Clinical Medical Hx: Prediabetes, HLD, Klinefelter's syndrome, NEW: Fatty Liver Medications: Metformin, Fenofibrate, Xyzal Labs: NEW: TGL - 128, LDL - 53, TC - 114, A1c - 5.9 Notable Signs/Symptoms: Diarrhea, Central Adiposity    Lifestyle & Dietary Hx Pt lab values are all dramatically improved. Pt reports adhering to a low sugar diet, cutting out sodas, and having low sugar cereal in the morning. Pt has increased their fiber as well. Pt has been eating leaner meats. Pt has been playing disc golf once a week. Pt has to walk the stairs to go to the bathroom, which has increased their steps each day. Pt reports eating more raw vegetables because they like the crunch. Pt reports their cravings to stop for junk-food have subsided.   Estimated daily fluid intake: ~200 oz Supplements: NEW: Men's One-A-Day Sleep: OSA, uses CPAP. Sleeps well, ~8 hours Stress / self-care: Low Current average weekly physical activity: ADLS, gym 3x a week   24-Hr Dietary Recall First Meal: Large bowl of Quaker oat squares, lactaid milk Snack: none Second Meal: Red sauce with ground beef, 3 bell peppers Snack: none Third Meal: Sausage and cheese quesadilla Snack: none Beverages: water, coffee with whole milk   NUTRITION DIAGNOSIS  NB-1.1 Food and nutrition-related knowledge deficit As related to Hypertriglyceridemia.  As evidenced by serum triglycerides of 308 mg/dL, dietary recall high in concentrated sugars, and SSB.Marland Kitchen   NUTRITION INTERVENTION  Nutrition education (E-1) on the following topics:  Educate pt on  the difference between LDL and HDL cholesterol. Educate pt on the factors that can increase and decrease HDL cholesterol. Including exercise to increase HDL, and tobacco use to decrease HDL. Educate pt on factors that can elevate LDL cholesterol, including high dietary intake of saturated fats. Educate pt on identifying sources of saturated fats, and how to make alternative food choices to lower saturated fat intake. Educate pt on the role of soluble fiber in binding to cholesterol in the GI tract an eliminating it from the body. Educate pt on dietary sources of soluble fiber. Educate pt on the potential dietary causes of hypertriglyceridemia.Educate on the role of elevated LDL,total cholesterol, and triglycerides on cardiovascular health. Educate pt on the role of physical activity in lowering LDL and increasing HDL cholesterol. Educated patient on the pathophysiology of diabetes. This includes why our bodies need circulating blood sugar, the relationship between insulin and blood sugar, and the results of insulin resistance and/or pancreatic insufficiency on the development of diabetes. Educated patient on factors that contribute to elevation of blood sugars, such as stress, illness, injury,and food choices. Discussed the role that physical activity plays in lowering blood sugar. Educate patient on the three main macronutrients. Protein, fats, and carbohydrates. Discussed how each of these macronutrients affect blood sugar levels, especially carbohydrate, and the importance of eating a consistent amount of carbohydrate throughout the day.   Handouts Provided Include  Latex Allergy Food List High Triglyceride Nutrition Therapy Nutrition Care Manual NAFLD Guidelines  Learning Style & Readiness for Change Teaching method utilized: Visual & Auditory  Demonstrated degree of understanding via: Teach Back  Barriers to learning/adherence to lifestyle change: None  Goals Established by  Pt Congratulations on  your lab values!! The hard work is paying off, these numbers don't lie. Look into "Carb Counter" tortillas for a high fiber, low calorie option. Keep up the great work being more active!   MONITORING & EVALUATION Dietary intake, weekly physical activity, and SSB intake in 6 months.  Next Steps  Patient is to follow up with RD.

## 2021-08-25 LAB — HM DIABETES EYE EXAM

## 2021-10-04 ENCOUNTER — Other Ambulatory Visit: Payer: Self-pay | Admitting: Nurse Practitioner

## 2021-10-04 NOTE — Telephone Encounter (Signed)
Requested medications are due for refill today.  yes ? ?Requested medications are on the active medications list.  yes ? ?Last refill. 04/11/2021 #90 1 refill ? ?Future visit scheduled.   yes ? ?Notes to clinic.  Failed refill protocol d/t abnormal labs. ? ? ? ?Requested Prescriptions  ?Pending Prescriptions Disp Refills  ? fenofibrate (TRICOR) 145 MG tablet [Pharmacy Med Name: FENOFIBRATE 145 MG TABLET] 90 tablet 1  ?  Sig: TAKE 1 TABLET BY MOUTH EVERY DAY  ?  ? Cardiovascular:  Antilipid - Fibric Acid Derivatives Failed - 10/04/2021  2:07 AM  ?  ?  Failed - ALT in normal range and within 360 days  ?  ALT  ?Date Value Ref Range Status  ?04/05/2021 112 (H) 0 - 44 IU/L Final  ?  ?  ?  ?  Failed - AST in normal range and within 360 days  ?  AST  ?Date Value Ref Range Status  ?04/05/2021 72 (H) 0 - 40 IU/L Final  ?  ?  ?  ?  Failed - Lipid Panel in normal range within the last 12 months  ?  Cholesterol, Total  ?Date Value Ref Range Status  ?07/28/2021 114 100 - 199 mg/dL Final  ? ?LDL Chol Calc (NIH)  ?Date Value Ref Range Status  ?07/28/2021 53 0 - 99 mg/dL Final  ? ?HDL  ?Date Value Ref Range Status  ?07/28/2021 38 (L) >39 mg/dL Final  ? ?Triglycerides  ?Date Value Ref Range Status  ?07/28/2021 128 0 - 149 mg/dL Final  ? ?  ?  ?  Passed - Cr in normal range and within 360 days  ?  Creatinine, Ser  ?Date Value Ref Range Status  ?04/05/2021 0.99 0.76 - 1.27 mg/dL Final  ?  ?  ?  ?  Passed - HGB in normal range and within 360 days  ?  Hemoglobin  ?Date Value Ref Range Status  ?04/05/2021 15.1 13.0 - 17.7 g/dL Final  ?  ?  ?  ?  Passed - HCT in normal range and within 360 days  ?  Hematocrit  ?Date Value Ref Range Status  ?06/09/2021 47.0 37.5 - 51.0 % Final  ?  ?  ?  ?  Passed - PLT in normal range and within 360 days  ?  Platelets  ?Date Value Ref Range Status  ?04/05/2021 294 150 - 450 x10E3/uL Final  ?  ?  ?  ?  Passed - WBC in normal range and within 360 days  ?  WBC  ?Date Value Ref Range Status  ?04/05/2021 8.5 3.4  - 10.8 x10E3/uL Final  ?  ?  ?  ?  Passed - eGFR is 30 or above and within 360 days  ?  GFR calc Af Amer  ?Date Value Ref Range Status  ?04/01/2020 110 >59 mL/min/1.73 Final  ?  Comment:  ?  **[facility] currently reports eGFR in compliance with the current** ?  recommendations of the [employer]. [facility] will ?  update reporting as new guidelines are published from the NKF-ASN ?  Task force. ?  ? ?GFR calc non Af Amer  ?Date Value Ref Range Status  ?04/01/2020 95 >59 mL/min/1.73 Final  ? ?eGFR  ?Date Value Ref Range Status  ?04/05/2021 102 >59 mL/min/1.73 Final  ?  ?  ?  ?  Passed - Valid encounter within last 12 months  ?  Recent Outpatient Visits   ? ?      ?  6 months ago Annual physical exam  ? [place], NP  ? 1 year ago Influenza vaccine needed  ? [facility] Kathrine Haddock, NP  ? 2 years ago Groin mass  ? [place], [place]  ? 3 years ago Sleep concern  ? [place], [place]  ? 4 years ago Fever, unspecified fever cause  ? [facility] Jeananne Rama, Jeannette How, MD  ? ?  ?  ?Future Appointments   ? ?        ? In 1 week Jon Billings, NP [facility], [facility]  ? In 6 months Jon Billings, NP [facility], [place]  ? In 8 months Stoioff, Ronda Fairly, MD [place]  ? ?  ? ?  ?  ?  ?  ?

## 2021-10-05 NOTE — Telephone Encounter (Signed)
Last seen in October 2022, has appointment 10/17/21 ?

## 2021-10-10 ENCOUNTER — Ambulatory Visit: Payer: Managed Care, Other (non HMO) | Admitting: Nurse Practitioner

## 2021-10-16 NOTE — Progress Notes (Signed)
? ?BP 134/81   Pulse 76   Temp 98 ?F (36.7 ?C) (Oral)   Wt (!) 338 lb 9.6 oz (153.6 kg)   SpO2 96%   BMI 37.20 kg/m?   ? ?Subjective:  ? ? Patient ID: Collin Butler, male    DOB: 1985/12/21, 36 y.o.   MRN: [id number] ? ?HPI: ?Collin Butler is a 36 y.o. male ? ?Chief Complaint  ?Patient presents with  ? Hyperlipidemia  ?  Follow up, states doing well. No concerns at this time.   ? ?HYPERLIPIDEMIA ?Patient states he is still working with the nutritionist.   ?Hyperlipidemia status: excellent compliance ?Satisfied with current treatment?  no ?Side effects:  no ?Medication compliance: excellent compliance ?Past cholesterol meds: fenofibrate (tricor) ?Supplements: none ?Aspirin:  no ?The ASCVD Risk score (Arnett DK, et al., 2019) failed to calculate for the following reasons: ?  The 2019 ASCVD risk score is only valid for ages 27 to 60 ?Chest pain:  no ?Coronary artery disease:  no ?Family history CAD:  no ?Family history early CAD:  no ? ? Denies HA, CP, SOB, dizziness, palpitations, visual changes, and lower extremity swelling. ? ? ?Relevant past medical, surgical, family and social history reviewed and updated as indicated. Interim medical history since our last visit reviewed. ?Allergies and medications reviewed and updated. ? ?Review of Systems  ?Eyes:  Negative for visual disturbance.  ?Respiratory:  Negative for chest tightness and shortness of breath.   ?Cardiovascular:  Negative for chest pain, palpitations and leg swelling.  ?Neurological:  Negative for dizziness, light-headedness and headaches.  ? ?Per HPI unless specifically indicated above ? ?   ?Objective:  ?  ?BP 134/81   Pulse 76   Temp 98 ?F (36.7 ?C) (Oral)   Wt (!) 338 lb 9.6 oz (153.6 kg)   SpO2 96%   BMI 37.20 kg/m?   ?Wt Readings from Last 3 Encounters:  ?10/17/21 (!) 338 lb 9.6 oz (153.6 kg)  ?08/09/21 (!) 334 lb 1.6 oz (151.5 kg)  ?06/13/21 (!) 335 lb 9.6 oz (152.2 kg)  ?  ?Physical Exam ?Vitals and nursing note reviewed.   ?Constitutional:   ?   General: He is not in acute distress. ?   Appearance: Normal appearance. He is obese. He is not ill-appearing, toxic-appearing or diaphoretic.  ?HENT:  ?   Head: Normocephalic.  ?   Right Ear: External ear normal.  ?   Left Ear: External ear normal.  ?   Nose: Nose normal. No congestion or rhinorrhea.  ?   Mouth/Throat:  ?   Mouth: Mucous membranes are moist.  ?Eyes:  ?   General:     ?   Right eye: No discharge.     ?   Left eye: No discharge.  ?   Extraocular Movements: Extraocular movements intact.  ?   Conjunctiva/sclera: Conjunctivae normal.  ?   Pupils: Pupils are equal, round, and reactive to light.  ?Cardiovascular:  ?   Rate and Rhythm: Normal rate and regular rhythm.  ?   Heart sounds: No murmur heard. ?Pulmonary:  ?   Effort: Pulmonary effort is normal. No respiratory distress.  ?   Breath sounds: Normal breath sounds. No wheezing, rhonchi or rales.  ?Abdominal:  ?   General: Abdomen is flat. Bowel sounds are normal.  ?Musculoskeletal:  ?   Cervical back: Normal range of motion and neck supple.  ?Skin: ?   General: Skin is warm and dry.  ?   Capillary Refill: Capillary  refill takes less than 2 seconds.  ?Neurological:  ?   General: No focal deficit present.  ?   Mental Status: He is alert and oriented to person, place, and time.  ?Psychiatric:     ?   Mood and Affect: Mood normal.     ?   Behavior: Behavior normal.     ?   Thought Content: Thought content normal.     ?   Judgment: Judgment normal.  ? ? ?Results for orders placed or performed in visit on 07/28/21  ?HgB A1c  ?Result Value Ref Range  ? Hgb A1c MFr Bld 5.9 (H) 4.8 - 5.6 %  ? Est. average glucose Bld gHb Est-mCnc 123 mg/dL  ?Lipid Profile  ?Result Value Ref Range  ? Cholesterol, Total 114 100 - 199 mg/dL  ? Triglycerides 128 0 - 149 mg/dL  ? HDL 38 (L) >39 mg/dL  ? VLDL Cholesterol Cal 23 5 - 40 mg/dL  ? LDL Chol Calc (NIH) 53 0 - 99 mg/dL  ? Chol/HDL Ratio 3.0 0.0 - 5.0 ratio  ? ?   ?Assessment & Plan:  ? ?Problem List  Items Addressed This Visit   ? ?  ? Other  ? Hypertriglyceridemia - Primary  ?  Chronic. Improved on last lab work.  Has been working with a Nutritionist.  Continue with Fenofibrate 134m daily.  Refill sent today.  Follow up in 6 months for reevaluation. Call sooner if concerns arise.  ? ?  ?  ? Relevant Medications  ? fenofibrate (TRICOR) 145 MG tablet  ? Other Relevant Orders  ? Comp Met (CMET)  ? Lipid Profile  ?  ? ?Follow up plan: ?Return in about 6 months (around 04/18/2022) for Physical and Fasting labs. ? ? ? ? ? ?

## 2021-10-17 ENCOUNTER — Ambulatory Visit: Payer: Managed Care, Other (non HMO) | Admitting: Nurse Practitioner

## 2021-10-17 ENCOUNTER — Encounter: Payer: Self-pay | Admitting: Nurse Practitioner

## 2021-10-17 VITALS — BP 134/81 | HR 76 | Temp 98.0°F | Wt 338.6 lb

## 2021-10-17 DIAGNOSIS — E781 Pure hyperglyceridemia: Secondary | ICD-10-CM | POA: Insufficient documentation

## 2021-10-17 MED ORDER — FENOFIBRATE 145 MG PO TABS: 145.0000 mg | ORAL_TABLET | Freq: Every day | ORAL | 1 refills | Status: DC

## 2021-10-17 NOTE — Assessment & Plan Note (Signed)
Chronic. Improved on last lab work.  Has been working with a Nutritionist.  Continue with Fenofibrate 145mg  daily.  Refill sent today.  Follow up in 6 months for reevaluation. Call sooner if concerns arise.  ?

## 2021-10-18 LAB — COMPREHENSIVE METABOLIC PANEL
ALT: 74 IU/L — ABNORMAL HIGH (ref 0–44)
AST: 47 IU/L — ABNORMAL HIGH (ref 0–40)
Albumin/Globulin Ratio: 1.7 (ref 1.2–2.2)
Albumin: 4.5 g/dL (ref 4.0–5.0)
Alkaline Phosphatase: 76 IU/L (ref 44–121)
BUN/Creatinine Ratio: 9 (ref 9–20)
BUN: 10 mg/dL (ref 6–20)
Bilirubin Total: 0.2 mg/dL (ref 0.0–1.2)
CO2: 26 mmol/L (ref 20–29)
Calcium: 9.8 mg/dL (ref 8.7–10.2)
Chloride: 101 mmol/L (ref 96–106)
Creatinine, Ser: 1.15 mg/dL (ref 0.76–1.27)
Globulin, Total: 2.7 g/dL (ref 1.5–4.5)
Glucose: 97 mg/dL (ref 70–99)
Potassium: 4.4 mmol/L (ref 3.5–5.2)
Sodium: 140 mmol/L (ref 134–144)
Total Protein: 7.2 g/dL (ref 6.0–8.5)
eGFR: 85 mL/min/{1.73_m2} (ref >59)

## 2021-10-18 LAB — LIPID PANEL
Chol/HDL Ratio: 3.7 ratio (ref 0.0–5.0)
Cholesterol, Total: 138 mg/dL (ref 100–199)
HDL: 37 mg/dL — ABNORMAL LOW (ref >39)
LDL Chol Calc (NIH): 63 mg/dL (ref 0–99)
Triglycerides: 236 mg/dL — ABNORMAL HIGH (ref 0–149)
VLDL Cholesterol Cal: 38 mg/dL (ref 5–40)

## 2021-10-18 NOTE — Progress Notes (Signed)
Hi Collin Butler.  Your lab work has improved from prior.  Liver enzymes and cholesterol are improved from 6 months ago.  However, your triglycerides are elevated from 2 months ago.  Recommend working on diet and exercise.  Please let me know if you have any questions.

## 2021-11-15 ENCOUNTER — Other Ambulatory Visit: Payer: Self-pay | Admitting: *Deleted

## 2021-11-15 DIAGNOSIS — E291 Testicular hypofunction: Secondary | ICD-10-CM

## 2021-11-15 DIAGNOSIS — Q984 Klinefelter syndrome, unspecified: Secondary | ICD-10-CM

## 2021-11-16 MED ORDER — XYOSTED 100 MG/0.5ML ~~LOC~~ SOAJ: 100.0000 mg | SUBCUTANEOUS | 3 refills | Status: DC

## 2021-12-08 ENCOUNTER — Other Ambulatory Visit: Payer: Managed Care, Other (non HMO)

## 2021-12-08 ENCOUNTER — Other Ambulatory Visit: Payer: Self-pay

## 2021-12-08 DIAGNOSIS — E291 Testicular hypofunction: Secondary | ICD-10-CM

## 2021-12-09 ENCOUNTER — Encounter: Payer: Self-pay | Admitting: Urology

## 2021-12-09 LAB — TESTOSTERONE: Testosterone: 571 ng/dL (ref 264–916)

## 2021-12-09 LAB — HEMATOCRIT: Hematocrit: 42.4 % (ref 37.5–51.0)

## 2022-01-04 ENCOUNTER — Other Ambulatory Visit: Payer: Self-pay | Admitting: Urology

## 2022-01-04 DIAGNOSIS — E291 Testicular hypofunction: Secondary | ICD-10-CM

## 2022-01-04 DIAGNOSIS — Q984 Klinefelter syndrome, unspecified: Secondary | ICD-10-CM

## 2022-01-09 ENCOUNTER — Ambulatory Visit: Payer: Managed Care, Other (non HMO) | Admitting: Dietician

## 2022-01-09 ENCOUNTER — Encounter: Payer: Managed Care, Other (non HMO) | Attending: Nurse Practitioner | Admitting: Skilled Nursing Facility1

## 2022-01-09 DIAGNOSIS — E781 Pure hyperglyceridemia: Secondary | ICD-10-CM | POA: Diagnosis not present

## 2022-01-09 DIAGNOSIS — R7309 Other abnormal glucose: Secondary | ICD-10-CM | POA: Insufficient documentation

## 2022-01-09 DIAGNOSIS — Z713 Dietary counseling and surveillance: Secondary | ICD-10-CM | POA: Diagnosis not present

## 2022-01-09 NOTE — Progress Notes (Signed)
Medical Nutrition Therapy  Appointment Start time:  1535  Appointment End time:  1610  Primary concerns today: Prediabetes, Cholesterol Referral diagnosis: E78.1 - Hypertriglyceridemia, R73.09 - Elevated Glucose Preferred learning style: No preference indicated Learning readiness: Ready   NUTRITION ASSESSMENT   Anthropometrics  Ht: 6'8"   Clinical Medical Hx: Prediabetes, HLD, Klinefelter's syndrome,  Fatty Liver Medications: Metformin, Fenofibrate, Xyzal Labs: TGL - 236, HDL 37 Notable Signs/Symptoms: Diarrhea, Central Adiposity    Lifestyle & Dietary Hx    Pt states he has fallen off the exercise routine due to needing a bathroom facility but now that the bathroom is done and his wife has not been wanting to workout. Pt states he is realizing he only works out if his wife does and has not been doing disc golf due to the air quality.  Pt states he will be visiting his family in [place] for the next week and then [place] for another week.  Pt states he does like the row machine and recumbent bike. Pt states he has cut back on overeating and not eating as much fats.    Body Composition Scale 01/09/2022  Current Body Weight 337.7  Total Body Fat % 32.5  Visceral Fat 21  Fat-Free Mass % 67.4   Total Body Water % 48.4  Muscle-Mass lbs 63.7  BMI 36.9  Body Fat Displacement          Torso  lbs 68.2         Left Leg  lbs 13.6         Right Leg  lbs 13.6         Left Arm  lbs 6.8         Right Arm   lbs 6.8      Estimated daily fluid intake: ~200 oz Supplements: Men's One-A-Day Sleep: OSA, uses CPAP. Sleeps well, ~8 hours Stress / self-care: Low Current average weekly physical activity: ADLS   24-Hr Dietary Recall First Meal 10-11: Large bowl (3-4 cups) of Quaker oat squares or raisin bran, lactaid milk and kefir Snack: none Second Meal: protein shake Snack: none Third Meal 6 or 7: chicken or pork + BBQ sauce  and zucchini and mushrooms Snack: none Beverages:  water, coffee with whole milk   NUTRITION DIAGNOSIS  NB-1.1 Food and nutrition-related knowledge deficit As related to Hypertriglyceridemia.  As evidenced by serum triglycerides of 308 mg/dL, dietary recall high in concentrated sugars, and SSB.Marland Kitchen   NUTRITION INTERVENTION: continued Nutrition education (E-1) on the following topics:  Educate pt on the difference between LDL and HDL cholesterol. Educate pt on the factors that can increase and decrease HDL cholesterol. Including exercise to increase HDL, and tobacco use to decrease HDL. Educate pt on factors that can elevate LDL cholesterol, including high dietary intake of saturated fats. Educate pt on identifying sources of saturated fats, and how to make alternative food choices to lower saturated fat intake. Educate pt on the role of soluble fiber in binding to cholesterol in the GI tract an eliminating it from the body. Educate pt on dietary sources of soluble fiber. Educate pt on the potential dietary causes of hypertriglyceridemia.Educate on the role of elevated LDL,total cholesterol, and triglycerides on cardiovascular health. Educate pt on the role of physical activity in lowering LDL and increasing HDL cholesterol. Educated patient on the pathophysiology of diabetes. This includes why our bodies need circulating blood sugar, the relationship between insulin and blood sugar, and the results of insulin resistance and/or pancreatic  insufficiency on the development of diabetes. Educated patient on factors that contribute to elevation of blood sugars, such as stress, illness, injury,and food choices. Discussed the role that physical activity plays in lowering blood sugar. Educate patient on the three main macronutrients. Protein, fats, and carbohydrates. Discussed how each of these macronutrients affect blood sugar levels, especially carbohydrate, and the importance of eating a consistent amount of carbohydrate throughout the day.    Handouts  Previously Provided Include  Latex Allergy Food List High Triglyceride Nutrition Therapy Nutrition Care Manual NAFLD Guidelines  Learning Style & Readiness for Change Teaching method utilized: Visual & Auditory  Demonstrated degree of understanding via: Teach Back  Barriers to learning/adherence to lifestyle change: None  Goals Established by Pt Congratulations on your lab values!! The hard work is paying off, these numbers don't lie. Look into "Carb Counter" tortillas for a high fiber, low calorie option. Keep up the great work being more active NEW: Fruit and vegetable with protein shake for lunch NEW: Pivot with different activity options to stay active   MONITORING & EVALUATION Dietary intake, weekly physical activity  Next Steps  Patient is to follow up with RD.

## 2022-04-02 ENCOUNTER — Other Ambulatory Visit: Payer: Self-pay | Admitting: Nurse Practitioner

## 2022-04-03 NOTE — Telephone Encounter (Signed)
Requested medication (s) are due for refill today: yes  Requested medication (s) are on the active medication list: yes  Last refill:  04/09/21 #180 3 RF  Future visit scheduled: yes  Notes to clinic:  overdue lab work-    Requested Prescriptions  Pending Prescriptions Disp Refills   metFORMIN (GLUCOPHAGE) 500 MG tablet [Pharmacy Med Name: METFORMIN HCL 500 MG TABLET] 180 tablet 3    Sig: TAKE 1 TAB IN THE AM FOR 2 WEEKS AND THEN INCREASE TO 1 TAB TWICE A DAY     Endocrinology:  Diabetes - Biguanides Failed - 04/02/2022  1:47 AM      Failed - HBA1C is between 0 and 7.9 and within 180 days    Hgb A1c MFr Bld  Date Value Ref Range Status  07/28/2021 5.9 (H) 4.8 - 5.6 % Final    Comment:             Prediabetes: 5.7 - 6.4          Diabetes: >6.4          Glycemic control for adults with diabetes: <7.0          Failed - B12 Level in normal range and within 720 days    No results found for: "VITAMINB12"       Failed - CBC within normal limits and completed in the last 12 months    WBC  Date Value Ref Range Status  04/05/2021 8.5 3.4 - 10.8 x10E3/uL Final   RBC  Date Value Ref Range Status  04/05/2021 5.91 (H) 4.14 - 5.80 x10E6/uL Final   Hemoglobin  Date Value Ref Range Status  04/05/2021 15.1 13.0 - 17.7 g/dL Final   Hematocrit  Date Value Ref Range Status  12/08/2021 42.4 37.5 - 51.0 % Final   MCHC  Date Value Ref Range Status  04/05/2021 31.3 (L) 31.5 - 35.7 g/dL Final   [facility]  Date Value Ref Range Status  04/05/2021 25.5 (L) 26.6 - 33.0 pg Final   MCV  Date Value Ref Range Status  04/05/2021 82 79 - 97 fL Final   No results found for: "PLTCOUNTKUC", "LABPLAT", "POCPLA" RDW  Date Value Ref Range Status  04/05/2021 15.1 11.6 - 15.4 % Final         Passed - Cr in normal range and within 360 days    Creatinine, Ser  Date Value Ref Range Status  10/17/2021 1.15 0.76 - 1.27 mg/dL Final         Passed - eGFR in normal range and within 360 days    GFR  calc Af Amer  Date Value Ref Range Status  04/01/2020 110 >59 mL/min/1.73 Final    Comment:    **[facility] currently reports eGFR in compliance with the current**   recommendations of the [employer]. [facility] will   update reporting as new guidelines are published from the NKF-ASN   Task force.    GFR calc non Af Amer  Date Value Ref Range Status  04/01/2020 95 >59 mL/min/1.73 Final   eGFR  Date Value Ref Range Status  10/17/2021 85 >59 mL/min/1.73 Final         Passed - Valid encounter within last 6 months    Recent Outpatient Visits           5 months ago Hypertriglyceridemia   [place], NP   12 months ago Annual physical exam   [facility] Jon Billings, NP  2 years ago Influenza vaccine needed   [place], NP   3 years ago Groin mass   [facility] Merrie Roof [place], [place]   4 years ago Sleep concern   [facility] Volney [place], [place]       Future Appointments             In 3 days Jon Billings, NP [facility], [place]   In 2 months Stoioff, Ronda Fairly, MD [place]

## 2022-04-05 NOTE — Progress Notes (Unsigned)
There were no vitals taken for this visit.   Subjective:    Patient ID: Collin Butler, male    DOB: 09/01/1985, 36 y.o.   MRN: [id number]  HPI: Collin Butler is a 36 y.o. male presenting on 04/06/2022 for comprehensive medical examination. Current medical complaints include:{Blank single:19197::"none","***"}  He currently lives with: Interim Problems from his last visit: {Blank single:19197::"yes","no"}  HYPERLIPIDEMIA Hyperlipidemia status: {Blank single:19197::"excellent compliance","good compliance","fair compliance","poor compliance"} Satisfied with current treatment?  {Blank single:19197::"yes","no"} Side effects:  {Blank single:19197::"yes","no"} Medication compliance: {Blank single:19197::"excellent compliance","good compliance","fair compliance","poor compliance"} Past cholesterol meds: {Blank multiple:19196::"none","atorvastain (lipitor)","lovastatin (mevacor)","pravastatin (pravachol)","rosuvastatin (crestor)","simvastatin (zocor)","vytorin","fenofibrate (tricor)","gemfibrozil","ezetimide (zetia)","niaspan","lovaza"} Supplements: {Blank multiple:19196::"none","fish oil","niacin","red yeast rice"} Aspirin:  {Blank single:19197::"yes","no"} The ASCVD Risk score (Arnett DK, et al., 2019) failed to calculate for the following reasons:   The 2019 ASCVD risk score is only valid for ages 37 to 52 Chest pain:  {Blank single:19197::"yes","no"} Coronary artery disease:  {Blank single:19197::"yes","no"} Family history CAD:  {Blank single:19197::"yes","no"} Family history early CAD:  {Blank single:19197::"yes","no"}  Depression Screen done today and results listed below:     10/17/2021    8:19 AM 04/13/2021    3:37 PM 04/05/2021    8:51 AM 04/01/2020    7:57 AM 03/31/2019    8:33 AM  Depression screen PHQ 2/9  Decreased Interest 0 0 0 0 0  Down, Depressed, Hopeless 0 0 0 0 0  PHQ - 2 Score 0 0 0 0 0  Altered sleeping 0  0  1  Tired, decreased energy 0  0  2  Change in appetite 0   0  0  Feeling bad or failure about yourself  0  0  1  Trouble concentrating 0  0  0  Moving slowly or fidgety/restless 0  0  0  Suicidal thoughts 0  0  0  PHQ-9 Score 0  0  4  Difficult doing work/chores Not difficult at all  Not difficult at all      The patient {has/does not have:19849} a history of falls. I {did/did not:19850} complete a risk assessment for falls. A plan of care for falls {was/was not:19852} documented.   Past Medical History:  Past Medical History:  Diagnosis Date   Asthma    Hypogonadism in male    Klinefelter syndrome    Peripheral vascular disease (HCC)    Sleep apnea     Surgical History:  Past Surgical History:  Procedure Laterality Date   NO PAST SURGERIES      Medications:  Current Outpatient Medications on File Prior to Visit  Medication Sig   fenofibrate (TRICOR) 145 MG tablet Take 1 tablet (145 mg total) by mouth daily.   levocetirizine (XYZAL) 5 MG tablet Take 5 mg by mouth every evening.   metFORMIN (GLUCOPHAGE) 500 MG tablet TAKE 1 TAB IN THE AM FOR 2 WEEKS AND THEN INCREASE TO 1 TAB TWICE A DAY   NEEDLE, DISP, 18 G 18G X 1" MISC Use as directed   prednisoLONE acetate (PRED FORTE) 1 % ophthalmic suspension 1 drop 3 (three) times daily.   SYRINGE-NEEDLE, DISP, 3 ML (LUER LOCK SAFETY SYRINGES) 21G X 1-1/2" 3 ML MISC Use as directed for testosterone administration   XYOSTED 100 MG/0.5ML SOAJ INJECT 1 PEN SUBCUTANEOUSLY ONCE EVERY WEEK   No current facility-administered medications on file prior to visit.    Allergies:  Allergies  Allergen Reactions   Avocado Itching   Banana Itching    Social History:  Social History   Socioeconomic History  Marital status: Married    Spouse name: Not on file   Number of children: Not on file   Years of education: Not on file   Highest education level: Not on file  Occupational History   Not on file  Tobacco Use   Smoking status: Never   Smokeless tobacco: Never  Vaping Use   Vaping Use:  Never used  Substance and Sexual Activity   Alcohol use: Yes   Drug use: No   Sexual activity: Yes    Birth control/protection: None  Other Topics Concern   Not on file  Social History Narrative   Not on file   Social Determinants of Health   Financial Resource Strain: Not on file  Food Insecurity: Not on file  Transportation Needs: Not on file  Physical Activity: Not on file  Stress: Not on file  Social Connections: Not on file  Intimate Partner Violence: Not on file   Social History   Tobacco Use  Smoking Status Never  Smokeless Tobacco Never   Social History   Substance and Sexual Activity  Alcohol Use Yes    Family History:  Family History  Problem Relation Age of Onset   Diabetes type I Sister    Cancer Maternal Grandmother    Hypertension Maternal Grandfather    Prostate cancer Neg Hx    Bladder Cancer Neg Hx    Kidney cancer Neg Hx     Past medical history, surgical history, medications, allergies, family history and social history reviewed with patient today and changes made to appropriate areas of the chart.   ROS All other ROS negative except what is listed above and in the HPI.      Objective:    There were no vitals taken for this visit.  Wt Readings from Last 3 Encounters:  01/09/22 (!) 337 lb 11.2 oz (153.2 kg)  10/17/21 (!) 338 lb 9.6 oz (153.6 kg)  08/09/21 (!) 334 lb 1.6 oz (151.5 kg)    Physical Exam  Results for orders placed or performed in visit on 12/08/21  Hematocrit  Result Value Ref Range   Hematocrit 42.4 37.5 - 51.0 %  Testosterone  Result Value Ref Range   Testosterone 571 264 - 916 ng/dL      Assessment & Plan:   Problem List Items Addressed This Visit       Other   Hypertriglyceridemia - Primary     Discussed aspirin prophylaxis for myocardial infarction prevention and decision was {Blank single:19197::"it was not indicated","made to continue ASA","made to start ASA","made to stop ASA","that we recommended  ASA, and patient refused"}  LABORATORY TESTING:  Health maintenance labs ordered today as discussed above.   The natural history of prostate cancer and ongoing controversy regarding screening and potential treatment outcomes of prostate cancer has been discussed with the patient. The meaning of a false positive PSA and a false negative PSA has been discussed. He indicates understanding of the limitations of this screening test and wishes *** to proceed with screening PSA testing.   IMMUNIZATIONS:   - Tdap: Tetanus vaccination status reviewed: {tetanus status:315746}. - Influenza: {Blank single:19197::"Up to date","Administered today","Postponed to flu season","Refused","Given elsewhere"} - Pneumovax: {Blank single:19197::"Up to date","Administered today","Not applicable","Refused","Given elsewhere"} - Prevnar: {Blank single:19197::"Up to date","Administered today","Not applicable","Refused","Given elsewhere"} - COVID: {Blank single:19197::"Up to date","Administered today","Not applicable","Refused","Given elsewhere"} - HPV: {Blank single:19197::"Up to date","Administered today","Not applicable","Refused","Given elsewhere"} - Shingrix vaccine: {Blank single:19197::"Up to date","Administered today","Not applicable","Refused","Given elsewhere"}  SCREENING: - Colonoscopy: {Blank single:19197::"Up to date","Ordered today","Not applicable","Refused","Done  elsewhere"}  Discussed with patient purpose of the colonoscopy is to detect colon cancer at curable precancerous or early stages   - AAA Screening: {Blank single:19197::"Up to date","Ordered today","Not applicable","Refused","Done elsewhere"}  -Hearing Test: {Blank single:19197::"Up to date","Ordered today","Not applicable","Refused","Done elsewhere"}  -Spirometry: {Blank single:19197::"Up to date","Ordered today","Not applicable","Refused","Done elsewhere"}   PATIENT COUNSELING:    Sexuality: Discussed sexually transmitted diseases, partner  selection, use of condoms, avoidance of unintended pregnancy  and contraceptive alternatives.   Advised to avoid cigarette smoking.  I discussed with the patient that most people either abstain from alcohol or drink within safe limits (<=14/week and <=4 drinks/occasion for males, <=7/weeks and <= 3 drinks/occasion for females) and that the risk for alcohol disorders and other health effects rises proportionally with the number of drinks per week and how often a drinker exceeds daily limits.  Discussed cessation/primary prevention of drug use and availability of treatment for abuse.   Diet: Encouraged to adjust caloric intake to maintain  or achieve ideal body weight, to reduce intake of dietary saturated fat and total fat, to limit sodium intake by avoiding high sodium foods and not adding table salt, and to maintain adequate dietary potassium and calcium preferably from fresh fruits, vegetables, and low-fat dairy products.    stressed the importance of regular exercise  Injury prevention: Discussed safety belts, safety helmets, smoke detector, smoking near bedding or upholstery.   Dental health: Discussed importance of regular tooth brushing, flossing, and dental visits.   Follow up plan: NEXT PREVENTATIVE PHYSICAL DUE IN 1 YEAR. No follow-ups on file.

## 2022-04-06 ENCOUNTER — Ambulatory Visit (INDEPENDENT_AMBULATORY_CARE_PROVIDER_SITE_OTHER): Payer: Managed Care, Other (non HMO) | Admitting: Nurse Practitioner

## 2022-04-06 ENCOUNTER — Encounter: Payer: Self-pay | Admitting: Nurse Practitioner

## 2022-04-06 VITALS — BP 129/87 | HR 67 | Temp 98.4°F | Ht >= 80 in | Wt 344.8 lb

## 2022-04-06 DIAGNOSIS — E1169 Type 2 diabetes mellitus with other specified complication: Secondary | ICD-10-CM | POA: Insufficient documentation

## 2022-04-06 DIAGNOSIS — Z0000 Encounter for general adult medical examination without abnormal findings: Secondary | ICD-10-CM | POA: Diagnosis not present

## 2022-04-06 DIAGNOSIS — E118 Type 2 diabetes mellitus with unspecified complications: Secondary | ICD-10-CM | POA: Diagnosis not present

## 2022-04-06 DIAGNOSIS — Z23 Encounter for immunization: Secondary | ICD-10-CM | POA: Diagnosis not present

## 2022-04-06 DIAGNOSIS — E781 Pure hyperglyceridemia: Secondary | ICD-10-CM

## 2022-04-06 LAB — URINALYSIS, ROUTINE W REFLEX MICROSCOPIC
Bilirubin, UA: NEGATIVE
Leukocytes,UA: NEGATIVE
Nitrite, UA: NEGATIVE
RBC, UA: NEGATIVE
Specific Gravity, UA: >1.03 — ABNORMAL HIGH (ref 1.005–1.030)
Urobilinogen, Ur: 0.2 mg/dL (ref 0.2–1.0)
pH, UA: 5.5 (ref 5.0–7.5)

## 2022-04-06 LAB — MICROSCOPIC EXAMINATION: Bacteria, UA: NONE SEEN

## 2022-04-06 MED ORDER — FENOFIBRATE 145 MG PO TABS: 145.0000 mg | ORAL_TABLET | Freq: Every day | ORAL | 1 refills | Status: DC

## 2022-04-06 MED ORDER — LISINOPRIL 5 MG PO TABS: 5.0000 mg | ORAL_TABLET | Freq: Every day | ORAL | 3 refills | Status: DC

## 2022-04-06 NOTE — Assessment & Plan Note (Signed)
Chronic.  Controlled.  Continue with current medication regimen of Triglycermidemia.  Refills sent today.  Labs ordered today.  Return to clinic in 6 months for reevaluation.  Call sooner if concerns arise.

## 2022-04-06 NOTE — Assessment & Plan Note (Signed)
Chronic.  Controlled.  Continue with current medication regimen of Metformin.  Lisinopril 5mg  started to help with kidney protection.  Microalbumin ordered today.  Labs ordered today.  Return to clinic in 6 months for reevaluation.  Call sooner if concerns arise.

## 2022-04-07 LAB — COMPREHENSIVE METABOLIC PANEL
ALT: 108 IU/L — ABNORMAL HIGH (ref 0–44)
AST: 83 IU/L — ABNORMAL HIGH (ref 0–40)
Albumin/Globulin Ratio: 1.6 (ref 1.2–2.2)
Albumin: 4.6 g/dL (ref 4.1–5.1)
Alkaline Phosphatase: 71 IU/L (ref 44–121)
BUN/Creatinine Ratio: 9 (ref 9–20)
BUN: 10 mg/dL (ref 6–20)
Bilirubin Total: 0.3 mg/dL (ref 0.0–1.2)
CO2: 24 mmol/L (ref 20–29)
Calcium: 9.6 mg/dL (ref 8.7–10.2)
Chloride: 98 mmol/L (ref 96–106)
Creatinine, Ser: 1.09 mg/dL (ref 0.76–1.27)
Globulin, Total: 2.8 g/dL (ref 1.5–4.5)
Glucose: 112 mg/dL — ABNORMAL HIGH (ref 70–99)
Potassium: 4.3 mmol/L (ref 3.5–5.2)
Sodium: 139 mmol/L (ref 134–144)
Total Protein: 7.4 g/dL (ref 6.0–8.5)
eGFR: 90 mL/min/{1.73_m2} (ref >59)

## 2022-04-07 LAB — CBC WITH DIFFERENTIAL/PLATELET
Basophils Absolute: 0.1 10*3/uL (ref 0.0–0.2)
Basos: 1 %
EOS (ABSOLUTE): 0.2 10*3/uL (ref 0.0–0.4)
Eos: 2 %
Hematocrit: 47.5 % (ref 37.5–51.0)
Hemoglobin: 15.1 g/dL (ref 13.0–17.7)
Immature Grans (Abs): 0 10*3/uL (ref 0.0–0.1)
Immature Granulocytes: 0 %
Lymphocytes Absolute: 2.7 10*3/uL (ref 0.7–3.1)
Lymphs: 36 %
MCH: 25.5 pg — ABNORMAL LOW (ref 26.6–33.0)
MCHC: 31.8 g/dL (ref 31.5–35.7)
MCV: 80 fL (ref 79–97)
Monocytes Absolute: 0.7 10*3/uL (ref 0.1–0.9)
Monocytes: 9 %
Neutrophils Absolute: 3.9 10*3/uL (ref 1.4–7.0)
Neutrophils: 52 %
Platelets: 254 10*3/uL (ref 150–450)
RBC: 5.93 x10E6/uL — ABNORMAL HIGH (ref 4.14–5.80)
RDW: 15 % (ref 11.6–15.4)
WBC: 7.5 10*3/uL (ref 3.4–10.8)

## 2022-04-07 LAB — HEMOGLOBIN A1C
Est. average glucose Bld gHb Est-mCnc: 140 mg/dL
Hgb A1c MFr Bld: 6.5 % — ABNORMAL HIGH (ref 4.8–5.6)

## 2022-04-07 LAB — LIPID PANEL
Chol/HDL Ratio: 4.1 ratio (ref 0.0–5.0)
Cholesterol, Total: 152 mg/dL (ref 100–199)
HDL: 37 mg/dL — ABNORMAL LOW (ref >39)
LDL Chol Calc (NIH): 79 mg/dL (ref 0–99)
Triglycerides: 213 mg/dL — ABNORMAL HIGH (ref 0–149)
VLDL Cholesterol Cal: 36 mg/dL (ref 5–40)

## 2022-04-07 LAB — TSH: TSH: 4.12 u[IU]/mL (ref 0.450–4.500)

## 2022-04-07 NOTE — Progress Notes (Signed)
Good Morning. It was nice to see you yesterday.  Your lab work looks good.  Cholesterol improved from prior.  Keep up the good work.  Your A1c did increase to 6.5.  I recommend a low fat and low carb diet.  Your liver enzymes are elevated from prior.  I recommend getting an ultrasound of your liver to evaluate it further.  I he agrees, I will order the ultrasound.  Continue with your current medication regimen.  Follow up as discussed.  Please let me know if you have any questions.

## 2022-04-24 ENCOUNTER — Other Ambulatory Visit: Payer: Self-pay | Admitting: Family Medicine

## 2022-04-24 DIAGNOSIS — Q984 Klinefelter syndrome, unspecified: Secondary | ICD-10-CM

## 2022-04-24 DIAGNOSIS — E291 Testicular hypofunction: Secondary | ICD-10-CM

## 2022-04-26 ENCOUNTER — Other Ambulatory Visit: Payer: Self-pay | Admitting: *Deleted

## 2022-04-26 DIAGNOSIS — E291 Testicular hypofunction: Secondary | ICD-10-CM

## 2022-04-26 DIAGNOSIS — Q984 Klinefelter syndrome, unspecified: Secondary | ICD-10-CM

## 2022-04-26 MED ORDER — XYOSTED 100 MG/0.5ML ~~LOC~~ SOAJ: SUBCUTANEOUS | 3 refills | Status: DC

## 2022-04-28 ENCOUNTER — Other Ambulatory Visit: Payer: Self-pay | Admitting: Urology

## 2022-04-28 DIAGNOSIS — E291 Testicular hypofunction: Secondary | ICD-10-CM

## 2022-04-28 DIAGNOSIS — Q984 Klinefelter syndrome, unspecified: Secondary | ICD-10-CM

## 2022-04-28 MED ORDER — XYOSTED 100 MG/0.5ML ~~LOC~~ SOAJ: SUBCUTANEOUS | 3 refills | Status: DC

## 2022-06-09 ENCOUNTER — Encounter: Payer: Self-pay | Admitting: Urology

## 2022-06-09 ENCOUNTER — Ambulatory Visit: Payer: Managed Care, Other (non HMO) | Admitting: Urology

## 2022-06-09 VITALS — BP 126/81 | HR 85 | Ht >= 80 in | Wt 340.0 lb

## 2022-06-09 DIAGNOSIS — Q984 Klinefelter syndrome, unspecified: Secondary | ICD-10-CM | POA: Diagnosis not present

## 2022-06-09 DIAGNOSIS — E291 Testicular hypofunction: Secondary | ICD-10-CM

## 2022-06-09 NOTE — Progress Notes (Signed)
   06/09/2022 11:38 AM   Collin Butler 05/10/1986 [id number]  Referring provider: Larae Grooms, NP [address] [place],  [place] [address]  Chief Complaint  Patient presents with   Hypogonadism    Urologic history: 1.  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 male presents for annual follow-up.  Doing well since last visit No bothersome LUTS Denies dysuria, gross hematuria Denies flank, abdominal or pelvic pain 6 month interim labs 12/2021: Testosterone 571, hematocrit 42.4  PMH: Past Medical History:  Diagnosis Date   Asthma    Hypogonadism in male    Klinefelter syndrome    Peripheral vascular disease (HCC)    Sleep apnea     Surgical History: Past Surgical History:  Procedure Laterality Date   NO PAST SURGERIES      Home Medications:  Allergies as of 06/09/2022       Reactions   Avocado Itching   Banana Itching        Medication List        Accurate as of June 09, 2022 11:38 AM. If you have any questions, ask your nurse or doctor.          fenofibrate 145 MG tablet Commonly known as: TRICOR Take 1 tablet (145 mg total) by mouth daily.   levocetirizine 5 MG tablet Commonly known as: XYZAL Take 5 mg by mouth every evening.   lisinopril 5 MG tablet Commonly known as: ZESTRIL Take 1 tablet (5 mg total) by mouth daily.   Luer Lock Safety Syringes 21G X 1-1/2" 3 ML Misc Generic drug: SYRINGE-NEEDLE (DISP) 3 ML Use as directed for testosterone administration   metFORMIN 500 MG tablet Commonly known as: GLUCOPHAGE TAKE 1 TAB IN THE AM FOR 2 WEEKS AND THEN INCREASE TO 1 TAB TWICE A DAY   MULTIVITAMIN MEN PO Take by mouth.   NEEDLE (DISP) 18 G 18G X 1" Misc Use as directed   prednisoLONE acetate 1 % ophthalmic suspension Commonly known as: PRED FORTE 1 drop 3 (three) times daily.   Xyosted 100 MG/0.5ML Soaj Generic drug: Testosterone Enanthate INJECT 1 PEN SUBCUTANEOUSLY ONCE EVERY  WEEK        Allergies:  Allergies  Allergen Reactions   Avocado Itching   Banana Itching    Family History: Family History  Problem Relation Age of Onset   Diabetes type I Sister    Cancer Maternal Grandmother    Hypertension Maternal Grandfather    Prostate cancer Neg Hx    Bladder Cancer Neg Hx    Kidney cancer Neg Hx     Social History:  reports that he has never smoked. He has never used smokeless tobacco. He reports current alcohol use. He reports that he does not use drugs.   Physical Exam: BP 126/81   Pulse 85   Ht 6\' 8"  (2.032 m)   Wt (!) 340 lb (154.2 kg)   BMI 37.35 kg/m   Constitutional:  Alert and oriented, No acute distress. HEENT: [place] AT Respiratory: Normal respiratory effort, no increased work of breathing. Psychiatric: Normal mood and affect.   Assessment & Plan:    1.  Hypogonadism Stable symptoms on TRT Testosterone, hematocrit today Lab visit 6 months for testosterone, hematocrit Continue annual follow-up   , MD  [facility] [address], [address] [place], [place] [phone]

## 2022-06-10 LAB — TESTOSTERONE: Testosterone: 618 ng/dL (ref 264–916)

## 2022-06-10 LAB — HEMATOCRIT: Hematocrit: 46.9 % (ref 37.5–51.0)

## 2022-06-11 ENCOUNTER — Encounter: Payer: Self-pay | Admitting: Urology

## 2022-07-20 LAB — HM DIABETES EYE EXAM

## 2022-07-25 ENCOUNTER — Encounter: Payer: Self-pay | Admitting: Nurse Practitioner

## 2022-09-04 ENCOUNTER — Other Ambulatory Visit: Payer: Self-pay | Admitting: Urology

## 2022-09-04 DIAGNOSIS — Q984 Klinefelter syndrome, unspecified: Secondary | ICD-10-CM

## 2022-09-04 DIAGNOSIS — E291 Testicular hypofunction: Secondary | ICD-10-CM

## 2022-09-23 IMAGING — US US ABDOMEN LIMITED
1 series · 14 of 25 positions shown · non-contrast
Comparison: None.

CLINICAL DATA: Elevated LFTs.

EXAM:
ULTRASOUND ABDOMEN LIMITED RIGHT UPPER QUADRANT

[Series 1: us abdomen limited · 0.26mm/px · 14 of 51 slices shown]
[im 1/51]
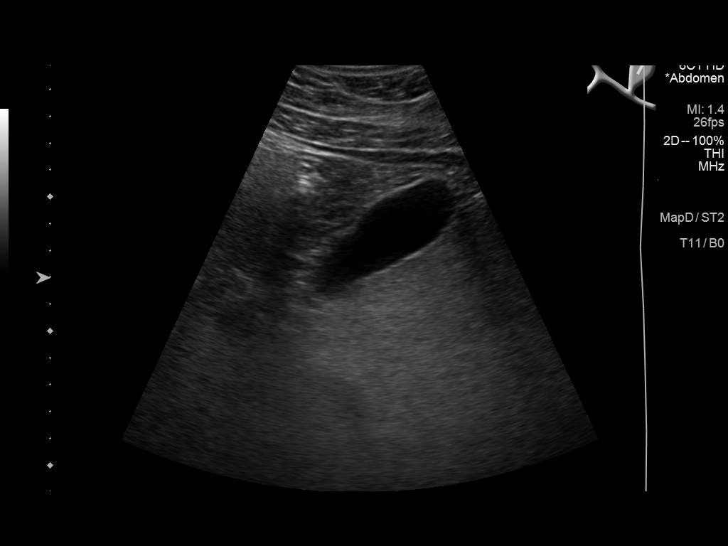
[im 5/51]
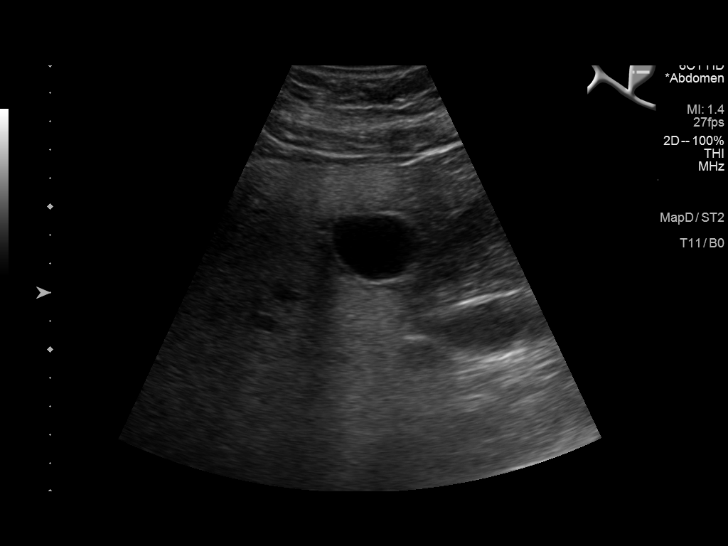
[im 9/51]
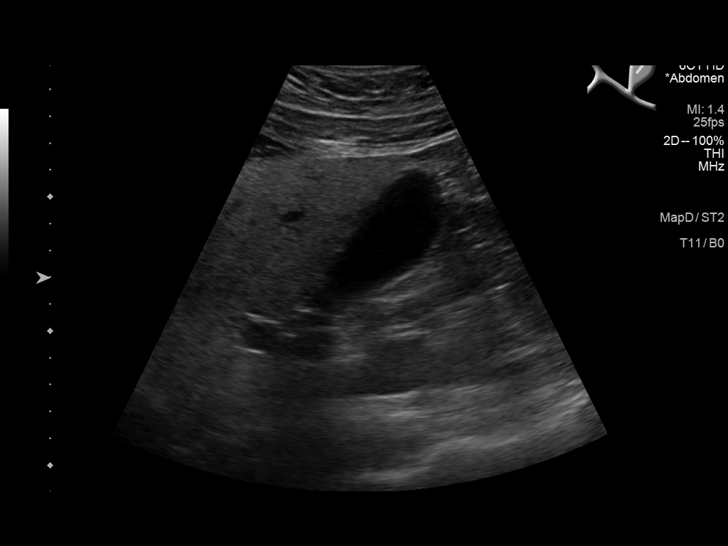
[im 13/51]
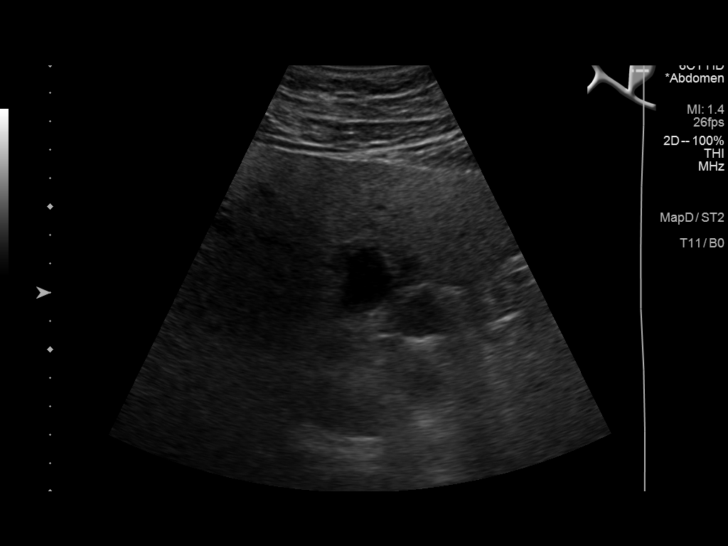
[im 17/51]
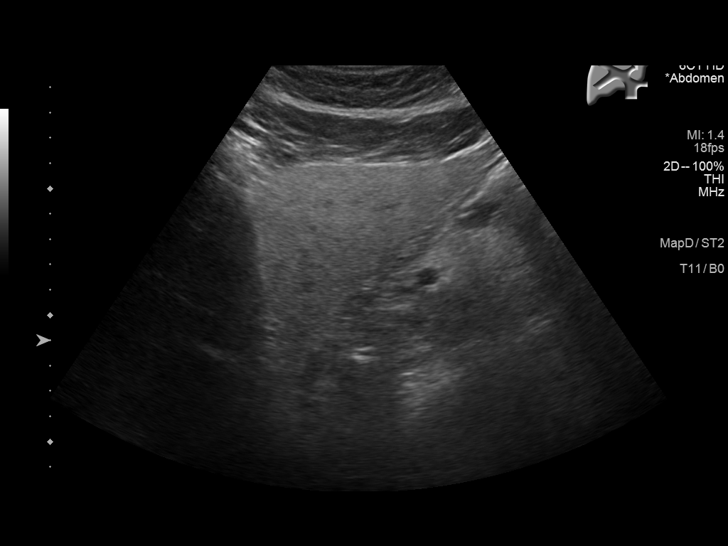
[im 19/51]
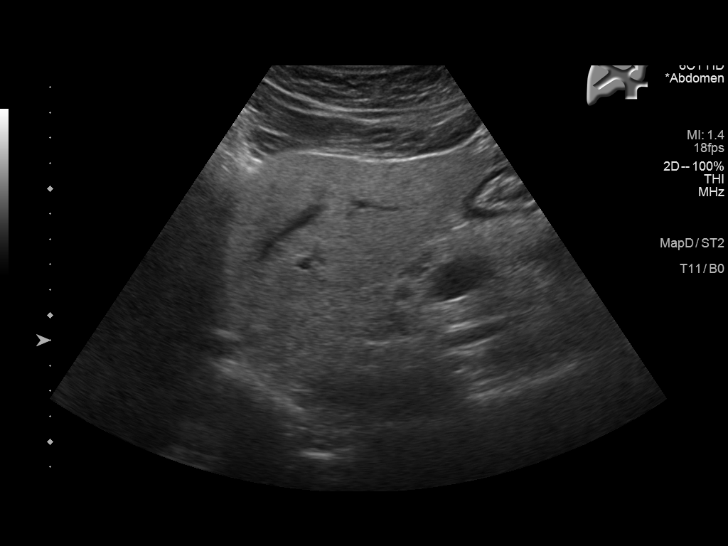
[im 23/51]
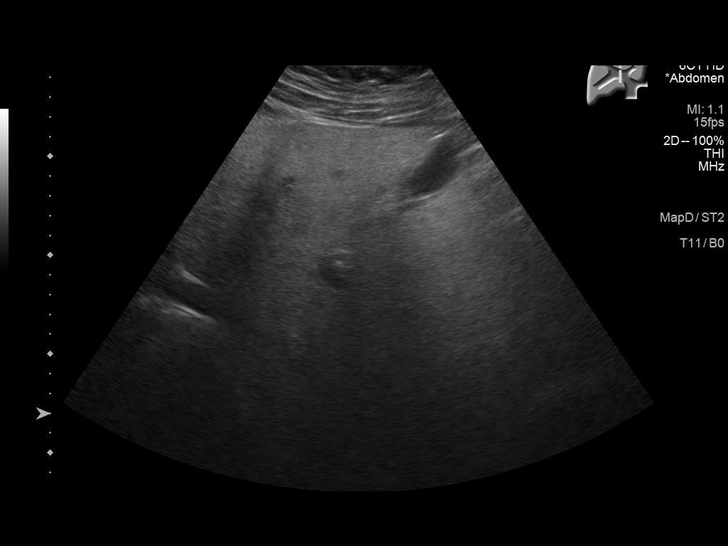
[im 28/51]
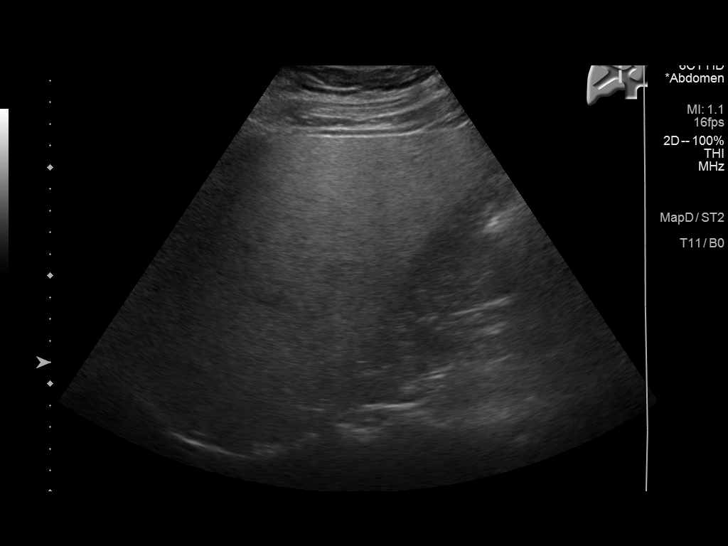
[im 32/51]
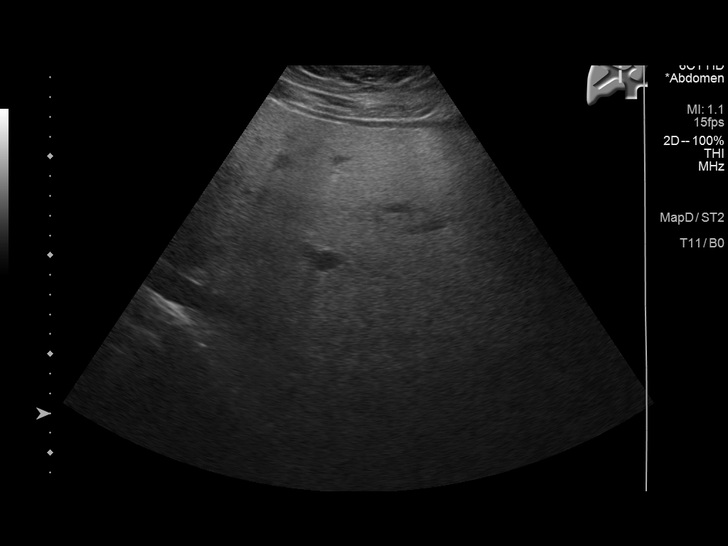
[im 34/51]
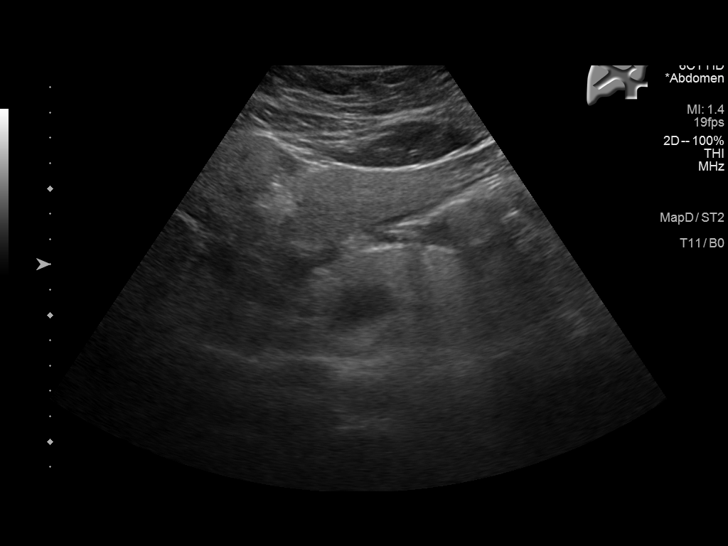
[im 38/51]
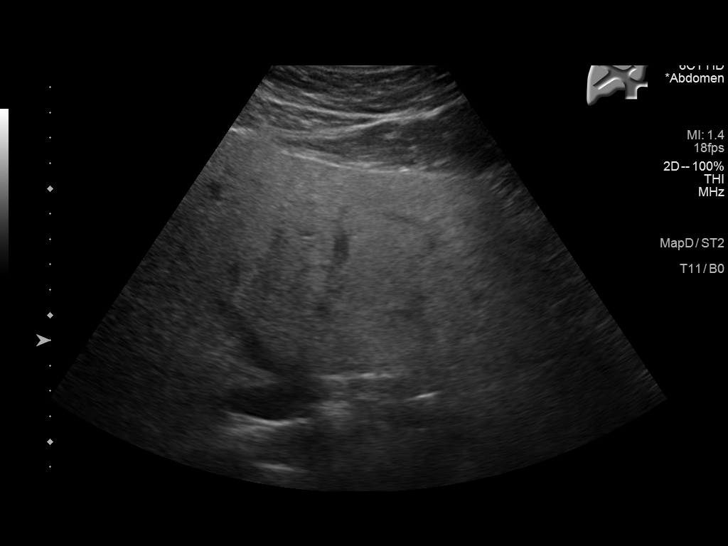
[im 42/51]
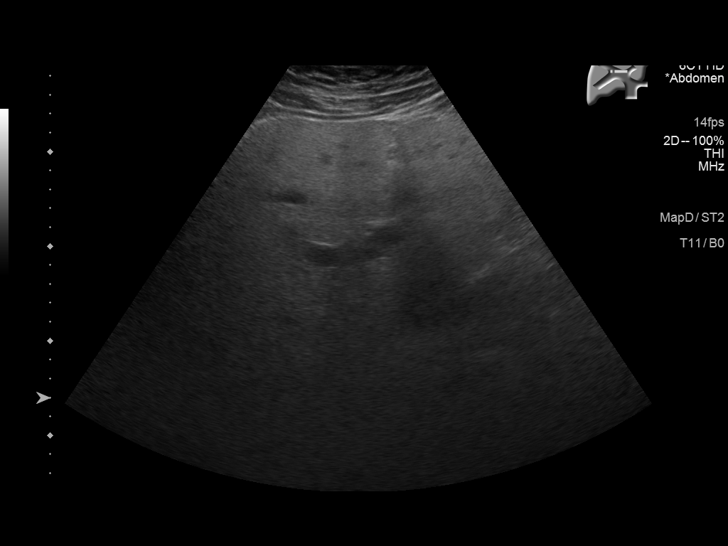
[im 46/51]
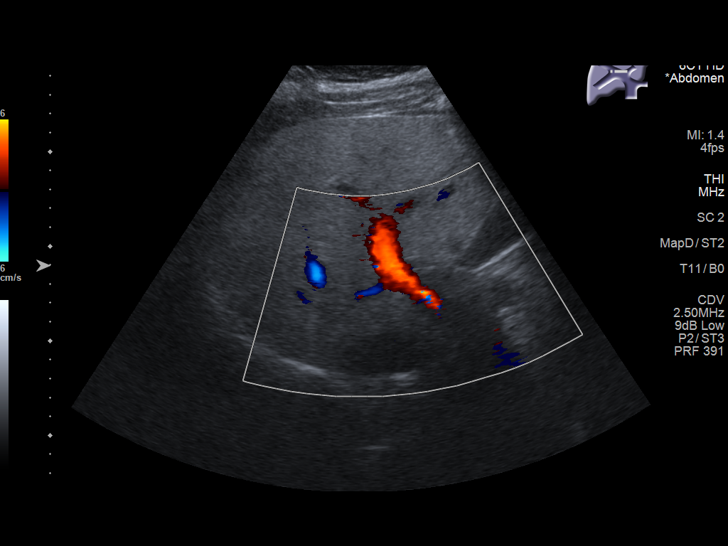
[im 51/51]
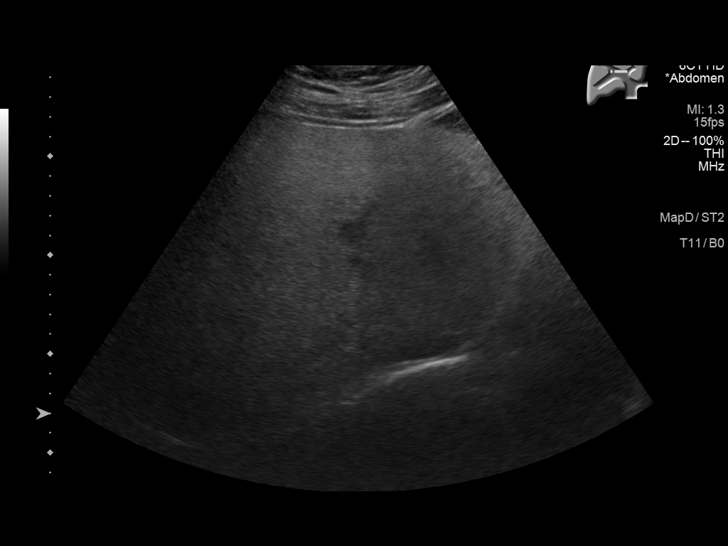

[14 of 25 positions shown; findings below may reference images not displayed]

FINDINGS: Gallbladder:

Physiologically distended. No gallstones or wall thickening
visualized. No sonographic Murphy sign noted by sonographer.

Common bile duct:

Diameter: 5-6 mm.

Liver:

Heterogeneous and diffusely increased in parenchymal echogenicity.
No discrete focal lesion. There is no capsular nodularity. Liver
spans 20 cm cranial caudal which may be normal for patient height of
6 foot 8 inches. Portal vein is patent on color Doppler imaging with
normal direction of blood flow towards the liver.

Other: No right upper quadrant ascites.
IMPRESSION: 1. Heterogeneous increased hepatic parenchymal echogenicity typical
of steatosis. No focal lesion.
2. Normal sonographic appearance of the gallbladder and biliary
tree.

## 2022-09-27 ENCOUNTER — Other Ambulatory Visit: Payer: Self-pay | Admitting: Nurse Practitioner

## 2022-09-27 NOTE — Telephone Encounter (Signed)
Requested Prescriptions  Pending Prescriptions Disp Refills   metFORMIN (GLUCOPHAGE) 500 MG tablet [Pharmacy Med Name: METFORMIN HCL 500 MG TABLET] 180 tablet 0    Sig: TAKE 1 TAB IN THE AM FOR 2 WEEKS AND THEN INCREASE TO 1 TAB TWICE A DAY     Endocrinology:  Diabetes - Biguanides Failed - 09/27/2022  2:18 AM      Failed - B12 Level in normal range and within 720 days    No results found for: "VITAMINB12"       Passed - Cr in normal range and within 360 days    Creatinine, Ser  Date Value Ref Range Status  04/06/2022 1.09 0.76 - 1.27 mg/dL Final         Passed - HBA1C is between 0 and 7.9 and within 180 days    Hgb A1c MFr Bld  Date Value Ref Range Status  04/06/2022 6.5 (H) 4.8 - 5.6 % Final    Comment:             Prediabetes: 5.7 - 6.4          Diabetes: >6.4          Glycemic control for adults with diabetes: <7.0          Passed - eGFR in normal range and within 360 days    GFR calc Af Amer  Date Value Ref Range Status  04/01/2020 110 >59 mL/min/1.73 Final    Comment:    **[facility] currently reports eGFR in compliance with the current**   recommendations of the [employer]. [facility] will   update reporting as new guidelines are published from the NKF-ASN   Task force.    GFR calc non Af Amer  Date Value Ref Range Status  04/01/2020 95 >59 mL/min/1.73 Final   eGFR  Date Value Ref Range Status  04/06/2022 90 >59 mL/min/1.73 Final         Passed - Valid encounter within last 6 months    Recent Outpatient Visits           5 months ago Annual physical exam   [place] Jon Billings, NP   11 months ago Hypertriglyceridemia   [place] Jon Billings, NP   1 year ago Annual physical exam   [place] Jon Billings, NP   2 years ago Influenza vaccine needed   [place] Kathrine Haddock, NP   3 years ago Groin mass   [place], Lilia [place], [place]       Future Appointments             In 1 week Jon Billings, NP [place]   In 8 months Stoioff, Ronda Fairly, MD [facility]            Passed - CBC within normal limits and completed in the last 12 months    WBC  Date Value Ref Range Status  04/06/2022 7.5 3.4 - 10.8 x10E3/uL Final   RBC  Date Value Ref Range Status  04/06/2022 5.93 (H) 4.14 - 5.80 x10E6/uL Final   Hemoglobin  Date Value Ref Range Status  04/06/2022 15.1 13.0 - 17.7 g/dL Final   Hematocrit  Date Value Ref Range Status  06/09/2022 46.9 37.5 - 51.0 % Final   MCHC  Date Value Ref Range Status  04/06/2022 31.8 31.5 - 35.7 g/dL Final  [facility]  Date Value Ref Range Status  04/06/2022 25.5 (L) 26.6 - 33.0 pg Final   MCV  Date Value Ref Range Status  04/06/2022 80 79 - 97 fL Final   No results found for: "PLTCOUNTKUC", "LABPLAT", "POCPLA" RDW  Date Value Ref Range Status  04/06/2022 15.0 11.6 - 15.4 % Final

## 2022-10-05 DIAGNOSIS — E1159 Type 2 diabetes mellitus with other circulatory complications: Secondary | ICD-10-CM | POA: Insufficient documentation

## 2022-10-05 NOTE — Progress Notes (Signed)
BP 130/72   Pulse 73   Temp 97.9 F (36.6 C) (Oral)   Wt (!) 340 lb 9.6 oz (154.5 kg)   SpO2 97%   BMI 37.42 kg/m    Subjective:    Patient ID: Collin Butler, male    DOB: 06/18/1986, 37 y.o.   MRN: [id number]  HPI: Collin Butler is a 37 y.o. male  Chief Complaint  Patient presents with   Diabetes   Hyperlipidemia   Hypertension   DIABETES Doing well with metformin.  Updated on his eye exam. Last A1c is 6.5%. See's a Nutritionist. Hypoglycemic episodes:no Polydipsia/polyuria: no Visual disturbance: no Chest pain: no Paresthesias: no Glucose Monitoring: no  Accucheck frequency: Not Checking  Fasting glucose:  Post prandial:  Evening:  Before meals: Taking Insulin?: no  Long acting insulin:  Short acting insulin: Blood Pressure Monitoring: not checking Retinal Examination: Up to Date Foot Exam: Up to Date Diabetic Education: Not Completed Pneumovax: Up to Date Influenza: Up to Date Aspirin: no  HYPERLIPIDEMIA Hyperlipidemia status: excellent compliance Satisfied with current treatment?  yes Side effects:  no Medication compliance: excellent compliance Past cholesterol meds: fenofibrate (tricor) Supplements: none Aspirin:  no The ASCVD Risk score (Arnett DK, et al., 2019) failed to calculate for the following reasons:   The 2019 ASCVD risk score is only valid for ages 63 to 5 Chest pain:  no Coronary artery disease:  no Family history CAD:  no Family history early CAD:  no   Patient states he feels like he isn't hearing as well as he used to.  He feels like it is just his left ear.   SLEEP APNEA Sleep apnea status: controlled Duration: months Satisfied with current treatment?:  yes CPAP use:  yes Sleep quality with CPAP use: excellent Treament compliance:excellent compliance Last sleep study: Prior to 2020 Treatments attempted: CPAP Wakes feeling refreshed:  yes Daytime hypersomnolence:  no Fatigue:  no Insomnia:  no Good sleep hygiene:   yes Difficulty falling asleep:  no Difficulty staying asleep:  no Snoring bothers bed partner:  no Observed apnea by bed partner: no Obesity:  yes Hypertension: no  Pulmonary hypertension:  no Coronary artery disease:  no   Relevant past medical, surgical, family and social history reviewed and updated as indicated. Interim medical history since our last visit reviewed. Allergies and medications reviewed and updated.  Review of Systems  HENT:  Positive for hearing loss.        Hearing loss in left ear  Eyes:  Negative for visual disturbance.  Respiratory:  Negative for chest tightness and shortness of breath.   Cardiovascular:  Negative for chest pain, palpitations and leg swelling.  Endocrine: Negative for polydipsia and polyuria.  Neurological:  Negative for dizziness, light-headedness, numbness and headaches.    Per HPI unless specifically indicated above     Objective:    BP 130/72   Pulse 73   Temp 97.9 F (36.6 C) (Oral)   Wt (!) 340 lb 9.6 oz (154.5 kg)   SpO2 97%   BMI 37.42 kg/m   Wt Readings from Last 3 Encounters:  10/06/22 (!) 340 lb 9.6 oz (154.5 kg)  06/09/22 (!) 340 lb (154.2 kg)  04/06/22 (!) 344 lb 12.8 oz (156.4 kg)    Physical Exam Vitals and nursing note reviewed.  Constitutional:      General: He is not in acute distress.    Appearance: Normal appearance. He is obese. He is not ill-appearing, toxic-appearing or diaphoretic.  HENT:  Head: Normocephalic.     Right Ear: External ear normal.     Left Ear: External ear normal.     Nose: Nose normal. No congestion or rhinorrhea.     Mouth/Throat:     Mouth: Mucous membranes are moist.  Eyes:     General:        Right eye: No discharge.        Left eye: No discharge.     Extraocular Movements: Extraocular movements intact.     Conjunctiva/sclera: Conjunctivae normal.     Pupils: Pupils are equal, round, and reactive to light.  Cardiovascular:     Rate and Rhythm: Normal rate and regular  rhythm.     Heart sounds: No murmur heard. Pulmonary:     Effort: Pulmonary effort is normal. No respiratory distress.     Breath sounds: Normal breath sounds. No wheezing, rhonchi or rales.  Abdominal:     General: Abdomen is flat. Bowel sounds are normal.  Musculoskeletal:     Cervical back: Normal range of motion and neck supple.  Skin:    General: Skin is warm and dry.     Capillary Refill: Capillary refill takes less than 2 seconds.  Neurological:     General: No focal deficit present.     Mental Status: He is alert and oriented to person, place, and time.  Psychiatric:        Mood and Affect: Mood normal.        Behavior: Behavior normal.        Thought Content: Thought content normal.        Judgment: Judgment normal.     Results for orders placed or performed in visit on 07/25/22  HM DIABETES EYE EXAM  Result Value Ref Range   HM Diabetic Eye Exam No Retinopathy No Retinopathy      Assessment & Plan:   Problem List Items Addressed This Visit       Cardiovascular and Mediastinum   Chronic venous insufficiency - Primary    Chronic.  Controlled.  Continue with current medication regimen.  Labs ordered today.  Return to clinic in 6 months for reevaluation.  Call sooner if concerns arise.        Relevant Medications   fenofibrate (TRICOR) 145 MG tablet   lisinopril (ZESTRIL) 5 MG tablet   rosuvastatin (CRESTOR) 5 MG tablet   Other Relevant Orders   Comp Met (CMET)     Respiratory   Sleep apnea   Relevant Orders   Ambulatory referral to Sleep Studies     Digestive   Fatty liver    Labs ordered at visit today.  Will make recommendations based on lab results.          Endocrine   Type 2 diabetes mellitus with hypertriglyceridemia    Chronic.  Controlled.  Continue with current medication regimen of Fenofibrate.  Labs ordered today.  Return to clinic in 6 months for reevaluation.  Call sooner if concerns arise.         Relevant Medications    fenofibrate (TRICOR) 145 MG tablet   lisinopril (ZESTRIL) 5 MG tablet   metFORMIN (GLUCOPHAGE) 500 MG tablet   rosuvastatin (CRESTOR) 5 MG tablet   Type 2 diabetes mellitus with circulatory disorder    Chronic.  Controlled.  Continue with current medication regimen of Metformin.  Lisinopril 5mg  started to help with kidney protection.  Microalbumin ordered today.  Last A1c was 6.5%.  Will add Crestor to help with  Cardiac protection.  Side effects and benefits of medication discussed during visit.  Labs ordered today.  Return to clinic in 3 months for reevaluation.  Call sooner if concerns arise.       Relevant Medications   lisinopril (ZESTRIL) 5 MG tablet   metFORMIN (GLUCOPHAGE) 500 MG tablet   rosuvastatin (CRESTOR) 5 MG tablet   Other Visit Diagnoses     Hearing difficulty of left ear       Referral placed for patient to have hearing tested with Audiology.   Relevant Orders   Ambulatory referral to ENT        Follow up plan: Return in about 3 months (around 01/05/2023) for HTN, HLD, DM2 FU.

## 2022-10-06 ENCOUNTER — Encounter: Payer: Self-pay | Admitting: Nurse Practitioner

## 2022-10-06 ENCOUNTER — Ambulatory Visit: Payer: Managed Care, Other (non HMO) | Admitting: Nurse Practitioner

## 2022-10-06 VITALS — BP 130/72 | HR 73 | Temp 97.9°F | Wt 340.6 lb

## 2022-10-06 DIAGNOSIS — E118 Type 2 diabetes mellitus with unspecified complications: Secondary | ICD-10-CM

## 2022-10-06 DIAGNOSIS — G4733 Obstructive sleep apnea (adult) (pediatric): Secondary | ICD-10-CM

## 2022-10-06 DIAGNOSIS — E1159 Type 2 diabetes mellitus with other circulatory complications: Secondary | ICD-10-CM | POA: Diagnosis not present

## 2022-10-06 DIAGNOSIS — H9192 Unspecified hearing loss, left ear: Secondary | ICD-10-CM

## 2022-10-06 DIAGNOSIS — E781 Pure hyperglyceridemia: Secondary | ICD-10-CM

## 2022-10-06 DIAGNOSIS — K760 Fatty (change of) liver, not elsewhere classified: Secondary | ICD-10-CM

## 2022-10-06 DIAGNOSIS — I872 Venous insufficiency (chronic) (peripheral): Secondary | ICD-10-CM

## 2022-10-06 LAB — MICROALBUMIN, URINE WAIVED
Creatinine, Urine Waived: 200 mg/dL (ref 10–300)
Microalb, Ur Waived: 80 mg/L — ABNORMAL HIGH (ref 0–19)
Microalb/Creat Ratio: <30 mg/g (ref <30)

## 2022-10-06 MED ORDER — LISINOPRIL 5 MG PO TABS: 5.0000 mg | ORAL_TABLET | Freq: Every day | ORAL | 1 refills | Status: DC

## 2022-10-06 MED ORDER — ROSUVASTATIN CALCIUM 5 MG PO TABS: 5.0000 mg | ORAL_TABLET | Freq: Every day | ORAL | 1 refills | Status: DC

## 2022-10-06 MED ORDER — FENOFIBRATE 145 MG PO TABS: 145.0000 mg | ORAL_TABLET | Freq: Every day | ORAL | 1 refills | Status: DC

## 2022-10-06 MED ORDER — METFORMIN HCL 500 MG PO TABS: ORAL_TABLET | ORAL | 1 refills | Status: DC

## 2022-10-06 NOTE — Assessment & Plan Note (Signed)
Chronic.  Controlled.  Continue with current medication regimen.  Labs ordered today.  Return to clinic in 6 months for reevaluation.  Call sooner if concerns arise.  ? ?

## 2022-10-06 NOTE — Assessment & Plan Note (Signed)
Chronic.  Controlled.  Continue with current medication regimen of Metformin.  Lisinopril 5mg  started to help with kidney protection.  Microalbumin ordered today.  Last A1c was 6.5%.  Will add Crestor to help with Cardiac protection.  Side effects and benefits of medication discussed during visit.  Labs ordered today.  Return to clinic in 3 months for reevaluation.  Call sooner if concerns arise.

## 2022-10-06 NOTE — Assessment & Plan Note (Signed)
Labs ordered at visit today.  Will make recommendations based on lab results.   

## 2022-10-06 NOTE — Assessment & Plan Note (Addendum)
Chronic.  Controlled.  Continue with current medication regimen of Fenofibrate.  Labs ordered today.  Return to clinic in 6 months for reevaluation.  Call sooner if concerns arise.

## 2022-10-07 LAB — LIPID PANEL
Chol/HDL Ratio: 4.2 ratio (ref 0.0–5.0)
Cholesterol, Total: 158 mg/dL (ref 100–199)
HDL: 38 mg/dL — ABNORMAL LOW (ref >39)
LDL Chol Calc (NIH): 77 mg/dL (ref 0–99)
Triglycerides: 259 mg/dL — ABNORMAL HIGH (ref 0–149)
VLDL Cholesterol Cal: 43 mg/dL — ABNORMAL HIGH (ref 5–40)

## 2022-10-07 LAB — COMPREHENSIVE METABOLIC PANEL
ALT: 113 IU/L — ABNORMAL HIGH (ref 0–44)
AST: 88 IU/L — ABNORMAL HIGH (ref 0–40)
Albumin/Globulin Ratio: 1.6 (ref 1.2–2.2)
Albumin: 4.6 g/dL (ref 4.1–5.1)
Alkaline Phosphatase: 67 IU/L (ref 44–121)
BUN/Creatinine Ratio: 12 (ref 9–20)
BUN: 13 mg/dL (ref 6–20)
Bilirubin Total: 0.3 mg/dL (ref 0.0–1.2)
CO2: 25 mmol/L (ref 20–29)
Calcium: 10.2 mg/dL (ref 8.7–10.2)
Chloride: 99 mmol/L (ref 96–106)
Creatinine, Ser: 1.13 mg/dL (ref 0.76–1.27)
Globulin, Total: 2.9 g/dL (ref 1.5–4.5)
Glucose: 140 mg/dL — ABNORMAL HIGH (ref 70–99)
Potassium: 4.6 mmol/L (ref 3.5–5.2)
Sodium: 137 mmol/L (ref 134–144)
Total Protein: 7.5 g/dL (ref 6.0–8.5)
eGFR: 86 mL/min/{1.73_m2} (ref >59)

## 2022-10-07 LAB — HEMOGLOBIN A1C
Est. average glucose Bld gHb Est-mCnc: 146 mg/dL
Hgb A1c MFr Bld: 6.7 % — ABNORMAL HIGH (ref 4.8–5.6)

## 2022-10-09 NOTE — Progress Notes (Signed)
Good Morning.  It was nice to see you last week.  Your lab work looks good.  Your liver enzymes are still elevated.  I recommend getting the US done of the liver to evaluate this further.  Your A1c did increase to 6.7%.  I think you would benefit from Ozempic injection weekly.  If you agree, I will send this to the pharmacy then I would like to see you back in the office so I can discuss how to give the injection and how we will titrate it up.  No other concerns at this time. Continue with your current medication regimen.  Follow up as discussed.  Please let me know if you have any questions.

## 2022-10-09 NOTE — Addendum Note (Signed)
Addended by: Larae Grooms on: 10/09/2022 10:09 AM   Modules accepted: Orders

## 2022-10-12 ENCOUNTER — Ambulatory Visit
Admission: RE | Admit: 2022-10-12 | Discharge: 2022-10-12 | Disposition: A | Discharge status: Home / Self Care | Payer: Managed Care, Other (non HMO) | Source: Ambulatory Visit | Attending: Nurse Practitioner | Admitting: Nurse Practitioner

## 2022-10-12 DIAGNOSIS — K760 Fatty (change of) liver, not elsewhere classified: Secondary | ICD-10-CM | POA: Insufficient documentation

## 2022-10-12 NOTE — Progress Notes (Signed)
Good Afternoon.  Your ultrasound does show some worsening fatty liver.  I recommend discussing this with your nutritionist and coming up with a plan.  Please let me know if you have any questions.

## 2022-10-17 ENCOUNTER — Encounter: Payer: Self-pay | Admitting: Skilled Nursing Facility1

## 2022-10-17 ENCOUNTER — Encounter: Payer: Managed Care, Other (non HMO) | Attending: Nurse Practitioner | Admitting: Skilled Nursing Facility1

## 2022-10-17 VITALS — Ht >= 80 in | Wt 337.8 lb

## 2022-10-17 DIAGNOSIS — E781 Pure hyperglyceridemia: Secondary | ICD-10-CM | POA: Diagnosis present

## 2022-10-17 DIAGNOSIS — E1169 Type 2 diabetes mellitus with other specified complication: Secondary | ICD-10-CM | POA: Insufficient documentation

## 2022-10-17 NOTE — Progress Notes (Signed)
Medical Nutrition Therapy  Appointment Start time:  1535  Appointment End time:  1610  Primary concerns today: Prediabetes, Cholesterol Referral diagnosis: E78.1 - Hypertriglyceridemia, R73.09 - Elevated Glucose Preferred learning style: No preference indicated Learning readiness: Ready   NUTRITION ASSESSMENT   Anthropometrics  Ht: 6'8"   Clinical Medical Hx: Prediabetes, HLD, Klinefelter's syndrome,  Fatty Liver Medications: Metformin, Fenofibrate, Xyzal, testosterone  Labs: A1C 6.7, triglycerides 259, HDL 38 Notable Signs/Symptoms: Diarrhea, Central Adiposity    Lifestyle & Dietary Hx   Pt states he has not bene quit sticking to his food schediuela dn ahs not been working out like he thought he would stating he pulled his back.  Pt states he has been disc golfing every week.  Pt states they might be putting him on Ozempic.  Pt states his energy level is not very good. Pt stets he sometimes will spend all day in bed due to feeling tired stating some of it is mental.  Pt states he is allergic to banana and avocado.  Pt states he has been doing more blood drives working them.   Body Composition Scale 01/09/2022 10/17/2022  Current Body Weight 337.7 337.8  Total Body Fat % 32.5 32.5  Visceral Fat 21 21  Fat-Free Mass % 67.4 67.4   Total Body Water % 48.4 48.4  Muscle-Mass lbs 63.7 63.7  BMI 36.9 36.9  Body Fat Displacement           Torso  lbs 68.2 68.2         Left Leg  lbs 13.6 13.6         Right Leg  lbs 13.6 13.6         Left Arm  lbs 6.8 6.8         Right Arm   lbs 6.8 6.8    Estimated daily fluid intake: ~200 oz Supplements: Men's One-A-Day Sleep: OSA, uses CPAP. Sleeps well, ~8 hours Stress / self-care: Low Current average weekly physical activity: ADLS   24-Hr Dietary Recall First Meal 10-11: 5 packets of oatmeal (brown sugar) Snack: 2 oranges  Second Meal: protein shake Snack: candy gummies  Third Meal 6 or 7: chicken or pork + BBQ sauce  and  zucchini and mushrooms or broccoli and 2-3 bell peppers Snack:  Beverages: water, coffee with whole milk, 1 soda a week,    NUTRITION DIAGNOSIS  NB-1.1 Food and nutrition-related knowledge deficit As related to Hypertriglyceridemia.  As evidenced by serum triglycerides of 259 mg/dL, dietary recall high in concentrated sugars, and SSB.Marland Kitchen   NUTRITION INTERVENTION: continued Nutrition education (E-1) on the following topics:  Educate pt on the difference between LDL and HDL cholesterol. Educate pt on the factors that can increase and decrease HDL cholesterol. Including exercise to increase HDL, and tobacco use to decrease HDL. Educate pt on factors that can elevate LDL cholesterol, including high dietary intake of saturated fats. Educate pt on identifying sources of saturated fats, and how to make alternative food choices to lower saturated fat intake. Educate pt on the role of soluble fiber in binding to cholesterol in the GI tract an eliminating it from the body. Educate pt on dietary sources of soluble fiber. Educate pt on the potential dietary causes of hypertriglyceridemia.Educate on the role of elevated LDL,total cholesterol, and triglycerides on cardiovascular health. Educate pt on the role of physical activity in lowering LDL and increasing HDL cholesterol. Educated patient on the pathophysiology of diabetes. This includes why our bodies need circulating blood sugar,  the relationship between insulin and blood sugar, and the results of insulin resistance and/or pancreatic insufficiency on the development of diabetes. Educated patient on factors that contribute to elevation of blood sugars, such as stress, illness, injury,and food choices. Discussed the role that physical activity plays in lowering blood sugar. Educate patient on the three main macronutrients. Protein, fats, and carbohydrates. Discussed how each of these macronutrients affect blood sugar levels, especially carbohydrate, and the  importance of eating a consistent amount of carbohydrate throughout the day.  Creation of balanced and diverse meals to increase the intake of nutrient-rich foods that provide essential vitamins, minerals, fiber, and phytonutrients Variety of Fruits and Vegetables: Aim for a colorful array of fruits and vegetables to ensure a wide range of nutrients. Include a mix of leafy greens, berries, citrus fruits, cruciferous vegetables, and more. Whole Grains: Choose whole grains over refined grains. Examples include brown rice, quinoa, oats, whole wheat, and barley. Lean Proteins: Include lean sources of protein, such as poultry, fish, tofu, legumes, beans, lentils, and low-fat dairy products. Limit red and processed meats. Healthy Fats: Incorporate sources of healthy fats, including avocados, nuts, seeds, and olive oil. Limit saturated and trans fats found in fried and processed foods. Dairy or Dairy Alternatives: Choose low-fat or fat-free dairy products, or plant-based alternatives like almond or soy milk. Portion Control: Be mindful of portion sizes to avoid overeating. Pay attention to hunger and satisfaction cues. Limit Added Sugars: Minimize the consumption of sugary beverages, snacks, and desserts. Check food labels for added sugars and opt for natural sources of sweetness such as whole fruits. Hydration: Drink plenty of water throughout the day. Limit sugary drinks and excessive caffeine intake. Moderate Sodium Intake: Reduce the consumption of high-sodium foods. Use herbs and spices for flavor instead of excessive salt. Meal Planning and Preparation: Plan and prepare meals ahead of time to make healthier choices more convenient. Include a mix of food groups in each meal. Limit Processed Foods: Minimize the intake of highly processed and packaged foods that are often high in added sugars, salt, and unhealthy fats. Regular Physical Activity: Combine a healthy diet with regular physical  activity for overall well-being. Aim for at least 150 minutes of moderate-intensity aerobic exercise per week, along with strength training. Moderation and Balance: Enjoy treats and indulgent foods in moderation, emphasizing balance rather than strict restriction.  Handouts Previously Provided Include  [employer] Allergy Food List High Triglyceride Nutrition Therapy Nutrition Care Manual NAFLD Guidelines  Learning Style & Readiness for Change Teaching method utilized: Visual & Auditory  Demonstrated degree of understanding via: Teach Back  Barriers to learning/adherence to lifestyle change: None  Goals Established by Pt Congratulations on your lab values!! The hard work is paying off, these numbers don't lie. Look into "Carb Counter" tortillas for a high fiber, low calorie option. Keep up the great work being more active continue: Fruit and vegetable with protein shake for lunch continue: Pivot with different activity options to stay active NEW: do disc golfing multiple mornings a week NEW: add in more non starchy vegetables NEW: choose complex carbohydrates    MONITORING & EVALUATION Dietary intake, weekly physical activity  Next Steps  Patient is to follow up in 6 months.

## 2022-12-07 ENCOUNTER — Other Ambulatory Visit: Payer: Managed Care, Other (non HMO)

## 2022-12-07 DIAGNOSIS — E291 Testicular hypofunction: Secondary | ICD-10-CM

## 2022-12-08 LAB — HEMATOCRIT: Hematocrit: 43.3 % (ref 37.5–51.0)

## 2022-12-08 LAB — TESTOSTERONE: Testosterone: 443 ng/dL (ref 264–916)

## 2022-12-25 ENCOUNTER — Other Ambulatory Visit: Payer: Self-pay | Admitting: Urology

## 2022-12-25 DIAGNOSIS — Q984 Klinefelter syndrome, unspecified: Secondary | ICD-10-CM

## 2022-12-25 DIAGNOSIS — E291 Testicular hypofunction: Secondary | ICD-10-CM

## 2023-03-23 ENCOUNTER — Other Ambulatory Visit: Payer: Self-pay | Admitting: Nurse Practitioner

## 2023-03-26 NOTE — Telephone Encounter (Signed)
Requested Prescriptions  Pending Prescriptions Disp Refills   rosuvastatin (CRESTOR) 5 MG tablet [Pharmacy Med Name: ROSUVASTATIN CALCIUM 5 MG TAB] 90 tablet 1    Sig: TAKE 1 TABLET (5 MG TOTAL) BY MOUTH DAILY.     Cardiovascular:  Antilipid - Statins 2 Failed - 03/23/2023  2:43 AM      Failed - Lipid Panel in normal range within the last 12 months    Cholesterol, Total  Date Value Ref Range Status  10/06/2022 158 100 - 199 mg/dL Final   LDL Chol Calc (NIH)  Date Value Ref Range Status  10/06/2022 77 0 - 99 mg/dL Final   HDL  Date Value Ref Range Status  10/06/2022 38 (L) >39 mg/dL Final   Triglycerides  Date Value Ref Range Status  10/06/2022 259 (H) 0 - 149 mg/dL Final         Passed - Cr in normal range and within 360 days    Creatinine, Ser  Date Value Ref Range Status  10/06/2022 1.13 0.76 - 1.27 mg/dL Final         Passed - Patient is not pregnant      Passed - Valid encounter within last 12 months    Recent Outpatient Visits           5 months ago Chronic venous insufficiency   [place] [facility] Larae Grooms, NP   11 months ago Annual physical exam   [place] [facility] Larae Grooms, NP   1 year ago Hypertriglyceridemia   [place] [facility] Larae Grooms, NP   1 year ago Annual physical exam   [place] [facility] Larae Grooms, NP   2 years ago Influenza vaccine needed   [place] [facility] Gabriel Cirri, NP       Future Appointments             In 1 week Evelene Croon, Atilano Median, MD [place] [facility], [facility]   In 2 months Stoioff, Verna Czech, MD [facility] [place]

## 2023-04-03 ENCOUNTER — Other Ambulatory Visit: Payer: Self-pay | Admitting: Nurse Practitioner

## 2023-04-04 NOTE — Telephone Encounter (Signed)
Requested Prescriptions  Pending Prescriptions Disp Refills   metFORMIN (GLUCOPHAGE) 500 MG tablet [Pharmacy Med Name: METFORMIN HCL 500 MG TABLET] 180 tablet 0    Sig: TAKE 1 TAB IN THE AM FOR 2 WEEKS AND THEN INCREASE TO 1 TAB TWICE A DAY     Endocrinology:  Diabetes - Biguanides Failed - 04/03/2023  5:07 PM      Failed - B12 Level in normal range and within 720 days    No results found for: "VITAMINB12"       Failed - CBC within normal limits and completed in the last 12 months    WBC  Date Value Ref Range Status  04/06/2022 7.5 3.4 - 10.8 x10E3/uL Final   RBC  Date Value Ref Range Status  04/06/2022 5.93 (H) 4.14 - 5.80 x10E6/uL Final   Hemoglobin  Date Value Ref Range Status  04/06/2022 15.1 13.0 - 17.7 g/dL Final   Hematocrit  Date Value Ref Range Status  12/07/2022 43.3 37.5 - 51.0 % Final   MCHC  Date Value Ref Range Status  04/06/2022 31.8 31.5 - 35.7 g/dL Final   [facility]  Date Value Ref Range Status  04/06/2022 25.5 (L) 26.6 - 33.0 pg Final   MCV  Date Value Ref Range Status  04/06/2022 80 79 - 97 fL Final   No results found for: "PLTCOUNTKUC", "LABPLAT", "POCPLA" RDW  Date Value Ref Range Status  04/06/2022 15.0 11.6 - 15.4 % Final         Passed - Cr in normal range and within 360 days    Creatinine, Ser  Date Value Ref Range Status  10/06/2022 1.13 0.76 - 1.27 mg/dL Final         Passed - HBA1C is between 0 and 7.9 and within 180 days    Hgb A1c MFr Bld  Date Value Ref Range Status  10/06/2022 6.7 (H) 4.8 - 5.6 % Final    Comment:             Prediabetes: 5.7 - 6.4          Diabetes: >6.4          Glycemic control for adults with diabetes: <7.0          Passed - eGFR in normal range and within 360 days    GFR calc Af Amer  Date Value Ref Range Status  04/01/2020 110 >59 mL/min/1.73 Final    Comment:    **[facility] currently reports eGFR in compliance with the current**   recommendations of the [employer]. [facility] will    update reporting as new guidelines are published from the NKF-ASN   Task force.    GFR calc non Af Amer  Date Value Ref Range Status  04/01/2020 95 >59 mL/min/1.73 Final   eGFR  Date Value Ref Range Status  10/06/2022 86 >59 mL/min/1.73 Final         Passed - Valid encounter within last 6 months    Recent Outpatient Visits           6 months ago Chronic venous insufficiency   [place] [facility] Larae Grooms, NP   12 months ago Annual physical exam   [place] [facility] Larae Grooms, NP   1 year ago Hypertriglyceridemia   [place] [facility] Larae Grooms, NP   1 year ago Annual physical exam   [place] [facility] Larae Grooms, NP   3 years ago Influenza vaccine  needed   [place] [facility] Gabriel Cirri, NP       Future Appointments             In 2 days Evelene Croon, Atilano Median, MD [place] [facility]   In 2 months Stoioff, Verna Czech, MD [facility] [place]             fenofibrate (TRICOR) 145 MG tablet [Pharmacy Med Name: FENOFIBRATE 145 MG TABLET] 90 tablet 0    Sig: TAKE 1 TABLET BY MOUTH EVERY DAY     Cardiovascular:  Antilipid - Fibric Acid Derivatives Failed - 04/03/2023  5:07 PM      Failed - ALT in normal range and within 360 days    ALT  Date Value Ref Range Status  10/06/2022 113 (H) 0 - 44 IU/L Final         Failed - AST in normal range and within 360 days    AST  Date Value Ref Range Status  10/06/2022 88 (H) 0 - 40 IU/L Final         Failed - HGB in normal range and within 360 days    Hemoglobin  Date Value Ref Range Status  04/06/2022 15.1 13.0 - 17.7 g/dL Final         Failed - PLT in normal range and within 360 days    Platelets  Date Value Ref Range Status  04/06/2022 254 150 - 450 x10E3/uL Final         Failed - WBC in normal range and within 360 days    WBC  Date Value Ref Range Status  04/06/2022  7.5 3.4 - 10.8 x10E3/uL Final         Failed - Lipid Panel in normal range within the last 12 months    Cholesterol, Total  Date Value Ref Range Status  10/06/2022 158 100 - 199 mg/dL Final   LDL Chol Calc (NIH)  Date Value Ref Range Status  10/06/2022 77 0 - 99 mg/dL Final   HDL  Date Value Ref Range Status  10/06/2022 38 (L) >39 mg/dL Final   Triglycerides  Date Value Ref Range Status  10/06/2022 259 (H) 0 - 149 mg/dL Final         Passed - Cr in normal range and within 360 days    Creatinine, Ser  Date Value Ref Range Status  10/06/2022 1.13 0.76 - 1.27 mg/dL Final         Passed - HCT in normal range and within 360 days    Hematocrit  Date Value Ref Range Status  12/07/2022 43.3 37.5 - 51.0 % Final         Passed - eGFR is 30 or above and within 360 days    GFR calc Af Amer  Date Value Ref Range Status  04/01/2020 110 >59 mL/min/1.73 Final    Comment:    **[facility] currently reports eGFR in compliance with the current**   recommendations of the [employer]. [facility] will   update reporting as new guidelines are published from the NKF-ASN   Task force.    GFR calc non Af Amer  Date Value Ref Range Status  04/01/2020 95 >59 mL/min/1.73 Final   eGFR  Date Value Ref Range Status  10/06/2022 86 >59 mL/min/1.73 Final         Passed - Valid encounter within last 12 months    Recent Outpatient Visits  6 months ago Chronic venous insufficiency   [place] [facility] Larae Grooms, NP   12 months ago Annual physical exam   [place] [facility] Larae Grooms, NP   1 year ago Hypertriglyceridemia   [place] [facility] Larae Grooms, NP   1 year ago Annual physical exam   [place] [facility] Larae Grooms, NP   3 years ago Influenza vaccine needed   [place] [facility] Gabriel Cirri, NP       Future Appointments             In 2  days Evelene Croon, Atilano Median, MD [place] [facility]   In 2 months Stoioff, Verna Czech, MD [facility]

## 2023-04-06 ENCOUNTER — Ambulatory Visit: Payer: Managed Care, Other (non HMO) | Admitting: Pediatrics

## 2023-04-06 ENCOUNTER — Encounter: Payer: Self-pay | Admitting: Pediatrics

## 2023-04-06 VITALS — BP 117/79 | HR 71 | Temp 97.9°F | Wt 333.2 lb

## 2023-04-06 DIAGNOSIS — E781 Pure hyperglyceridemia: Secondary | ICD-10-CM | POA: Diagnosis not present

## 2023-04-06 DIAGNOSIS — E1169 Type 2 diabetes mellitus with other specified complication: Secondary | ICD-10-CM

## 2023-04-06 DIAGNOSIS — R7401 Elevation of levels of liver transaminase levels: Secondary | ICD-10-CM | POA: Diagnosis not present

## 2023-04-06 DIAGNOSIS — Z23 Encounter for immunization: Secondary | ICD-10-CM | POA: Diagnosis not present

## 2023-04-06 DIAGNOSIS — Z7985 Long-term (current) use of injectable non-insulin antidiabetic drugs: Secondary | ICD-10-CM

## 2023-04-06 LAB — MICROALBUMIN, URINE WAIVED
Creatinine, Urine Waived: 300 mg/dL (ref 10–300)
Microalb, Ur Waived: 150 mg/L — ABNORMAL HIGH (ref 0–19)

## 2023-04-06 MED ORDER — SEMAGLUTIDE(0.25 OR 0.5MG/DOS) 2 MG/3ML ~~LOC~~ SOPN
0.2500 mg | PEN_INJECTOR | SUBCUTANEOUS | 0 refills | Status: AC
Start: 2023-04-06 — End: 2023-05-06

## 2023-04-06 MED ORDER — SEMAGLUTIDE(0.25 OR 0.5MG/DOS) 2 MG/3ML ~~LOC~~ SOPN
0.5000 mg | PEN_INJECTOR | SUBCUTANEOUS | 0 refills | Status: DC
Start: 2023-05-06 — End: 2023-05-30

## 2023-04-06 NOTE — Assessment & Plan Note (Addendum)
Last A1c 6.7. Follows with nutrition. Plan on starting ozempic today. Instructions reviewed. Labs as below. Return in 2 months. UTD on UACR, eye exam, foot exam, on a statin.

## 2023-04-06 NOTE — Progress Notes (Signed)
   Office Visit  BP 117/79   Pulse 71   Temp 97.9 F (36.6 C) (Oral)   Wt (!) 333 lb 3.2 oz (151.1 kg)   BMI 36.60 kg/m    Subjective:    Patient ID: Collin Butler, male    DOB: [birth year], 37 y.o.   MRN: [id number]  HPI: Collin Butler is a 37 y.o. male  Chief Complaint  Patient presents with   Diabetes   DM Recommended for ozempic at last visit Has not started this, was concerned about cost Follows with nutrition closely Denies vision changes, increased urinary frequency, urgency, neuropathy, increased thirst  Relevant past medical, surgical, family and social history reviewed and updated as indicated. Interim medical history since our last visit reviewed. Allergies and medications reviewed and updated.  ROS per HPI unless specifically indicated above     Objective:    BP 117/79   Pulse 71   Temp 97.9 F (36.6 C) (Oral)   Wt (!) 333 lb 3.2 oz (151.1 kg)   BMI 36.60 kg/m   Wt Readings from Last 3 Encounters:  04/06/23 (!) 333 lb 3.2 oz (151.1 kg)  10/17/22 (!) 337 lb 12.8 oz (153.2 kg)  10/06/22 (!) 340 lb 9.6 oz (154.5 kg)     Physical Exam Constitutional:      Appearance: Normal appearance.  Pulmonary:     Effort: Pulmonary effort is normal.  Musculoskeletal:        General: Normal range of motion.  Skin:    Comments: Normal skin color  Neurological:     General: No focal deficit present.     Mental Status: He is alert. Mental status is at baseline.  Psychiatric:        Mood and Affect: Mood normal.        Behavior: Behavior normal.        Thought Content: Thought content normal.        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 Follows with nutrition. Plan on starting ozempic today. Instructions reviewed. Labs as below. Return in 2 months. UTD on UACR, eye exam, foot exam, on a statin.  Orders: -     Microalbumin / creatinine urine ratio -     Lipid  panel -     Semaglutide(0.25 or 0.5MG /DOS); Inject 0.25 mg into the skin once a week.  Dispense: 3 mL; Refill: 0 -     Semaglutide(0.25 or 0.5MG /DOS); Inject 0.5 mg into the skin once a week.  Dispense: 3 mL; Refill: 0  Transaminitis Noted on previously blood work. Has h/o fatty liver. Repeat labs today. -     Comprehensive metabolic panel  Need for vaccination -     Flu vaccine trivalent PF, 6mos and older(Flulaval,Afluria,Fluarix,Fluzone)  Follow up plan: Return in about 2 months (around 06/06/2023) for DM fu.  Collin Baeten Howell Pringle, MD

## 2023-04-06 NOTE — Addendum Note (Signed)
Addended by: Jackolyn Confer on: 04/06/2023 12:18 PM   Modules accepted: Orders

## 2023-04-06 NOTE — Patient Instructions (Signed)
The schedule for Ozempic is: 0.25mg  for 4 weeks 0.5mg  for 4 weeks RETURN VISIT IN CLINIC TO CHECK IN 1mg  for 4 weeks 2mg  goal dose  The most common adverse reactions, reported in >=5% of patients treated with Ozempic are nausea, vomiting, diarrhea, abdominal pain, and constipation. These typically resolve over time but please let me know if they do not resolve.  How to use pen Text PEN to [phone] to get a link to the video to learn how to use the Ozempic pen.  Get savings information Eligible patients pay as little as $25a for a 15-month, 34-month, or 4-month prescription by texting BEGIN to [phone] or registering on [email or web address]  For instructional videos and more information about ozempic go here: [email or web address]

## 2023-04-07 LAB — LIPID PANEL
Chol/HDL Ratio: 3.4 {ratio} (ref 0.0–5.0)
Cholesterol, Total: 127 mg/dL (ref 100–199)
HDL: 37 mg/dL — ABNORMAL LOW (ref >39)
LDL Chol Calc (NIH): 51 mg/dL (ref 0–99)
Triglycerides: 248 mg/dL — ABNORMAL HIGH (ref 0–149)
VLDL Cholesterol Cal: 39 mg/dL (ref 5–40)

## 2023-04-07 LAB — COMPREHENSIVE METABOLIC PANEL
ALT: 116 [IU]/L — ABNORMAL HIGH (ref 0–44)
AST: 96 [IU]/L — ABNORMAL HIGH (ref 0–40)
Albumin: 4.7 g/dL (ref 4.1–5.1)
Alkaline Phosphatase: 82 [IU]/L (ref 44–121)
BUN/Creatinine Ratio: 10 (ref 9–20)
BUN: 12 mg/dL (ref 6–20)
Bilirubin Total: 0.3 mg/dL (ref 0.0–1.2)
CO2: 26 mmol/L (ref 20–29)
Calcium: 10 mg/dL (ref 8.7–10.2)
Chloride: 99 mmol/L (ref 96–106)
Creatinine, Ser: 1.15 mg/dL (ref 0.76–1.27)
Globulin, Total: 2.9 g/dL (ref 1.5–4.5)
Glucose: 173 mg/dL — ABNORMAL HIGH (ref 70–99)
Potassium: 4.4 mmol/L (ref 3.5–5.2)
Sodium: 139 mmol/L (ref 134–144)
Total Protein: 7.6 g/dL (ref 6.0–8.5)
eGFR: 84 mL/min/{1.73_m2} (ref >59)

## 2023-04-17 ENCOUNTER — Telehealth: Payer: Self-pay | Admitting: Nurse Practitioner

## 2023-04-17 ENCOUNTER — Ambulatory Visit: Payer: Managed Care, Other (non HMO) | Admitting: Skilled Nursing Facility1

## 2023-04-17 ENCOUNTER — Encounter: Payer: Self-pay | Admitting: Dietician

## 2023-04-17 ENCOUNTER — Encounter: Payer: Managed Care, Other (non HMO) | Attending: Nurse Practitioner | Admitting: Dietician

## 2023-04-17 VITALS — Wt 335.0 lb

## 2023-04-17 DIAGNOSIS — E781 Pure hyperglyceridemia: Secondary | ICD-10-CM | POA: Diagnosis present

## 2023-04-17 DIAGNOSIS — R7309 Other abnormal glucose: Secondary | ICD-10-CM

## 2023-04-17 NOTE — Patient Instructions (Signed)
New Goals:  Add a protein source to breakfast. Play disc golf once a week.  Get out rowing machine and do 10 minutes daily.

## 2023-04-17 NOTE — Telephone Encounter (Signed)
OPENED IN ERROR

## 2023-04-17 NOTE — Progress Notes (Signed)
Medical Nutrition Therapy  Appointment Start time:  1400  Appointment End time:  1430  Primary concerns today: Prediabetes, Cholesterol Referral diagnosis: E78.1 - Hypertriglyceridemia, R73.09 - Elevated Glucose Preferred learning style: No preference indicated Learning readiness: Ready   NUTRITION ASSESSMENT   Anthropometrics  Ht: 6'8" Wt: 335 lb  Clinical Medical Hx: type 2 diabetes, HLD, Klinefelter's syndrome, fatty liver, sleep apnea, asthma Medications: Metformin, Fenofibrate, Xyzal, testosterone, semaglutide Labs: A1C 6.7, triglycerides 248, HDL 37 Notable Signs/Symptoms: Diarrhea, Central Adiposity  Food Allergies: banana, avocado  Lifestyle & Dietary Hx  Triglycerides reduced since previous visit. Weight down 2 lb since previous visit.   Pt states he started ozempic last week. States he has not noticed any changes yet.   Pt states he's been eating more vegetables. He reports he has been eating celery every day because it is easy to have on hand. Pt states he does the cooking.  Pt reports he hasn't been exercising as much because his wife has been working late and he enjoys disc golf but he has found that decreased too due to the rain. He reports he plans to play more often throughout the fall and winter.    Body Composition Scale 04/17/2023 10/17/2022  Current Body Weight 335  337.8  Total Body Fat % 32.2 32.5  Visceral Fat 20 21  Fat-Free Mass % 67.7 67.4   Total Body Water % 48.7 48.4  Muscle-Mass lbs 63.2 63.7  BMI 36.6 36.9  Body Fat Displacement           Torso  lbs 67.1 68.2         Left Leg  lbs 13.4 13.6         Right Leg  lbs 13.4 13.6         Left Arm  lbs 6.7 6.8         Right Arm   lbs 6.7 6.8    Estimated daily fluid intake: 120 oz Supplements: Men's One-A-Day Sleep: OSA, uses CPAP. Sleeps well, ~8 hours Stress / self-care: Low Current average weekly physical activity: ADLS  24-Hr Dietary Recall First Meal 10-11: 3-4 servings oatmeal or  cereal with lactaid milk  Snack: none Second Meal: celery Snack: none Third Meal 6 or 7: thai coconut curry with chicken and rice and peas and onion Snack: none reported Beverages: water, coffee with whole milk, 1 soda a week,    NUTRITION DIAGNOSIS  NB-1.1 Food and nutrition-related knowledge deficit As related to Hypertriglyceridemia.  As evidenced by serum triglycerides of 259 mg/dL, dietary recall high in concentrated sugars, and SSB.Marland Kitchen   NUTRITION INTERVENTION: continued Nutrition education (E-1) on the following topics:   Plate Method Fruits & Vegetables: Aim to fill half your plate with a variety of fruits and vegetables. They are rich in vitamins, minerals, and fiber, and can help reduce the risk of chronic diseases. Choose a colorful assortment of fruits and vegetables to ensure you get a wide range of nutrients. Grains and Starches: Make at least half of your grain choices whole grains, such as brown rice, whole wheat bread, and oats. Whole grains provide fiber, which aids in digestion and healthy cholesterol levels. Aim for whole forms of starchy vegetables such as potatoes, sweet potatoes, beans, peas, and corn, which are fiber rich and provide many vitamins and minerals.  Protein: Incorporate lean sources of protein, such as poultry, fish, beans, nuts, and seeds, into your meals. Protein is essential for building and repairing tissues, staying full, balancing blood  sugar, as well as supporting immune function. Dairy: Include low-fat or fat-free dairy products like milk, yogurt, and cheese in your diet. Dairy foods are excellent sources of calcium and vitamin D, which are crucial for bone health.   Physical Activity Aim for 150 minutes of physical activity weekly. Make physical activity a part of your week. Try to include at least 30 minutes of physical activity 5 days each week or at least 150 minutes per week. Regular physical activity promotes overall health-including helping to  reduce risk for heart disease and diabetes, promoting mental health, and helping [place] sleep better.     Handouts Previously Provided Include: Latex Allergy Food List High Triglyceride Nutrition Therapy Nutrition Care Manual NAFLD Guidelines  Handouts provided today: Plate Method Non-starchy vegetable list  Learning Style & Readiness for Change Teaching method utilized: Visual & Auditory  Demonstrated degree of understanding via: Teach Back  Barriers to learning/adherence to lifestyle change: None  Goals Established by Pt  Previous Goals:  Congratulations on your lab values!! The hard work is paying off, these numbers don't lie. Fruit and vegetable with protein shake for lunch - goal not met Pivot with different activity options to stay active - reassessed goal add in more non starchy vegetables - goal met, continue choose complex carbohydrates - goal met, continue  New Goals:  Add a protein source to breakfast. Play disc golf once a week.  Get out rowing machine and do 10 minutes daily.   MONITORING & EVALUATION Dietary intake, weekly physical activity  Next Steps  Patient is to follow up in 6 months.

## 2023-04-17 NOTE — Telephone Encounter (Signed)
Referral placed.

## 2023-05-03 ENCOUNTER — Other Ambulatory Visit: Payer: Self-pay | Admitting: Urology

## 2023-05-03 DIAGNOSIS — Q984 Klinefelter syndrome, unspecified: Secondary | ICD-10-CM

## 2023-05-03 DIAGNOSIS — E291 Testicular hypofunction: Secondary | ICD-10-CM

## 2023-05-17 ENCOUNTER — Telehealth: Payer: Self-pay | Admitting: *Deleted

## 2023-05-17 NOTE — Telephone Encounter (Signed)
Your request has been denied  CaseId:[id number];Status:Denied;Review Type:Prior Auth;Appeal Information: Attention:[employer] [address],[place],[address]; Important - Please read the below note on eAppeals: Please reference the denial letter for information on the rights for an appeal, rationale for the denial, and how to submit an appeal including if any information is needed to support the appeal. Note about urgent situations - Generally, an urgent situation is one which, in the opinion of the provider, the health of the patient may be in serious jeopardy or may experience pain that cannot be adequately controlled while waiting for a decision on the appeal.

## 2023-05-17 NOTE — Telephone Encounter (Signed)
Stoioff patient:  PA sent thru [facility] [place] [facility] (Key: [id number])

## 2023-05-18 NOTE — Telephone Encounter (Signed)
Spoke with patient and advised xyosted was denied, pt would like to know the next steps

## 2023-05-21 NOTE — Telephone Encounter (Signed)
He can pay out of pocket at $150 per month for [facility] or switch to IM testosterone or topical gels

## 2023-05-21 NOTE — Telephone Encounter (Signed)
Patient states he is getting a new insurance next month . He will make sure we get a copy. He will pay out of pocket

## 2023-05-29 ENCOUNTER — Other Ambulatory Visit: Payer: Self-pay | Admitting: Pediatrics

## 2023-05-29 DIAGNOSIS — E1169 Type 2 diabetes mellitus with other specified complication: Secondary | ICD-10-CM

## 2023-05-29 NOTE — Telephone Encounter (Signed)
Requested medication (s) are due for refill today:   Requested medication (s) are on the active medication list: Yes  Last refill:  04/06/23  Future visit scheduled: Yes  Notes to clinic:  Se request.    Requested Prescriptions  Pending Prescriptions Disp Refills   OZEMPIC, 0.25 OR 0.5 MG/DOSE, 2 MG/3ML SOPN [Pharmacy Med Name: OZEMPIC 0.25-0.5 MG/DOSE PEN]      Sig: INJECT 0.5 MG INTO THE SKIN ONE TIME PER WEEK     Endocrinology:  Diabetes - GLP-1 Receptor Agonists - semaglutide Failed - 05/29/2023  2:25 AM      Failed - HBA1C in normal range and within 180 days    Hgb A1c MFr Bld  Date Value Ref Range Status  10/06/2022 6.7 (H) 4.8 - 5.6 % Final    Comment:             Prediabetes: 5.7 - 6.4          Diabetes: >6.4          Glycemic control for adults with diabetes: <7.0          Passed - Cr in normal range and within 360 days    Creatinine, Ser  Date Value Ref Range Status  04/06/2023 1.15 0.76 - 1.27 mg/dL Final         Passed - Valid encounter within last 6 months    Recent Outpatient Visits           1 month ago 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)   [place] [facility] Jackolyn Confer, MD   7 months ago Chronic venous insufficiency   [place] [facility] Larae Grooms, NP   1 year ago Annual physical exam   [facility] Larae Grooms, NP   1 year ago Hypertriglyceridemia   [place] [facility] Larae Grooms, NP   2 years ago Annual physical exam   [place] [facility] Larae Grooms, NP       Future Appointments             In 1 week Larae Grooms, NP [place] [facility]   In 1 week Stoioff, Verna Czech, MD [facility] [place]

## 2023-06-05 NOTE — Progress Notes (Unsigned)
There were no vitals taken for this visit.   Subjective:    Patient ID: Collin Butler, male    DOB: 04-05-1986, 37 y.o.   MRN: [id number]  HPI: Collin Butler is a 37 y.o. male  No chief complaint on file.  DIABETES Doing well with metformin.  Updated on his eye exam. Last A1c is 6.5%. See's a Nutritionist. Hypoglycemic episodes:no Polydipsia/polyuria: no Visual disturbance: no Chest pain: no Paresthesias: no Glucose Monitoring: no  Accucheck frequency: Not Checking  Fasting glucose:  Post prandial:  Evening:  Before meals: Taking Insulin?: no  Long acting insulin:  Short acting insulin: Blood Pressure Monitoring: not checking Retinal Examination: Up to Date Foot Exam: Up to Date Diabetic Education: Not Completed Pneumovax: Up to Date Influenza: Up to Date Aspirin: no  HYPERLIPIDEMIA Hyperlipidemia status: excellent compliance Satisfied with current treatment?  yes Side effects:  no Medication compliance: excellent compliance Past cholesterol meds: fenofibrate (tricor) Supplements: none Aspirin:  no The ASCVD Risk score (Arnett DK, et al., 2019) failed to calculate for the following reasons:   The 2019 ASCVD risk score is only valid for ages 69 to 57 Chest pain:  no Coronary artery disease:  no Family history CAD:  no Family history early CAD:  no   Patient states he feels like he isn't hearing as well as he used to.  He feels like it is just his left ear.   SLEEP APNEA Sleep apnea status: controlled Duration: months Satisfied with current treatment?:  yes CPAP use:  yes Sleep quality with CPAP use: excellent Treament compliance:excellent compliance Last sleep study: Prior to 2020 Treatments attempted: CPAP Wakes feeling refreshed:  yes Daytime hypersomnolence:  no Fatigue:  no Insomnia:  no Good sleep hygiene:  yes Difficulty falling asleep:  no Difficulty staying asleep:  no Snoring bothers bed partner:  no Observed apnea by bed partner:  no Obesity:  yes Hypertension: no  Pulmonary hypertension:  no Coronary artery disease:  no   Relevant past medical, surgical, family and social history reviewed and updated as indicated. Interim medical history since our last visit reviewed. Allergies and medications reviewed and updated.  Review of Systems  HENT:  Positive for hearing loss.        Hearing loss in left ear  Eyes:  Negative for visual disturbance.  Respiratory:  Negative for chest tightness and shortness of breath.   Cardiovascular:  Negative for chest pain, palpitations and leg swelling.  Endocrine: Negative for polydipsia and polyuria.  Neurological:  Negative for dizziness, light-headedness, numbness and headaches.    Per HPI unless specifically indicated above     Objective:    There were no vitals taken for this visit.  Wt Readings from Last 3 Encounters:  04/17/23 (!) 335 lb (152 kg)  04/06/23 (!) 333 lb 3.2 oz (151.1 kg)  10/17/22 (!) 337 lb 12.8 oz (153.2 kg)    Physical Exam Vitals and nursing note reviewed.  Constitutional:      General: He is not in acute distress.    Appearance: Normal appearance. He is obese. He is not ill-appearing, toxic-appearing or diaphoretic.  HENT:     Head: Normocephalic.     Right Ear: External ear normal.     Left Ear: External ear normal.     Nose: Nose normal. No congestion or rhinorrhea.     Mouth/Throat:     Mouth: Mucous membranes are moist.  Eyes:     General:  Right eye: No discharge.        Left eye: No discharge.     Extraocular Movements: Extraocular movements intact.     Conjunctiva/sclera: Conjunctivae normal.     Pupils: Pupils are equal, round, and reactive to light.  Cardiovascular:     Rate and Rhythm: Normal rate and regular rhythm.     Heart sounds: No murmur heard. Pulmonary:     Effort: Pulmonary effort is normal. No respiratory distress.     Breath sounds: Normal breath sounds. No wheezing, rhonchi or rales.  Abdominal:      General: Abdomen is flat. Bowel sounds are normal.  Musculoskeletal:     Cervical back: Normal range of motion and neck supple.  Skin:    General: Skin is warm and dry.     Capillary Refill: Capillary refill takes less than 2 seconds.  Neurological:     General: No focal deficit present.     Mental Status: He is alert and oriented to person, place, and time.  Psychiatric:        Mood and Affect: Mood normal.        Behavior: Behavior normal.        Thought Content: Thought content normal.        Judgment: Judgment normal.    Results for orders placed or performed in visit on 04/06/23  Comp Met (CMET)  Result Value Ref Range   Glucose 173 (H) 70 - 99 mg/dL   BUN 12 6 - 20 mg/dL   Creatinine, Ser 4.09 0.76 - 1.27 mg/dL   eGFR 84 >81 XB/JYN/8.29   BUN/Creatinine Ratio 10 9 - 20   Sodium 139 134 - 144 mmol/L   Potassium 4.4 3.5 - 5.2 mmol/L   Chloride 99 96 - 106 mmol/L   CO2 26 20 - 29 mmol/L   Calcium 10.0 8.7 - 10.2 mg/dL   Total Protein 7.6 6.0 - 8.5 g/dL   Albumin 4.7 4.1 - 5.1 g/dL   Globulin, Total 2.9 1.5 - 4.5 g/dL   Bilirubin Total 0.3 0.0 - 1.2 mg/dL   Alkaline Phosphatase 82 44 - 121 IU/L   AST 96 (H) 0 - 40 IU/L   ALT 116 (H) 0 - 44 IU/L  Lipid Profile  Result Value Ref Range   Cholesterol, Total 127 100 - 199 mg/dL   Triglycerides 562 (H) 0 - 149 mg/dL   HDL 37 (L) >13 mg/dL   VLDL Cholesterol Cal 39 5 - 40 mg/dL   LDL Chol Calc (NIH) 51 0 - 99 mg/dL   Chol/HDL Ratio 3.4 0.0 - 5.0 ratio  Microalbumin, Urine Waived (STAT)  Result Value Ref Range   Microalb, Ur Waived 150 (H) 0 - 19 mg/L   Creatinine, Urine Waived 300 10 - 300 mg/dL   Microalb/Creat Ratio 30-300 (H) <30 mg/g      Assessment & Plan:   Problem List Items Addressed This Visit       Endocrine   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

## 2023-06-06 ENCOUNTER — Ambulatory Visit: Payer: Managed Care, Other (non HMO) | Admitting: Nurse Practitioner

## 2023-06-11 ENCOUNTER — Ambulatory Visit: Payer: Managed Care, Other (non HMO) | Admitting: Urology

## 2023-06-20 ENCOUNTER — Ambulatory Visit (INDEPENDENT_AMBULATORY_CARE_PROVIDER_SITE_OTHER): Payer: Self-pay | Admitting: Nurse Practitioner

## 2023-06-20 ENCOUNTER — Encounter: Payer: Self-pay | Admitting: Nurse Practitioner

## 2023-06-20 VITALS — BP 117/75 | HR 84 | Temp 98.3°F | Ht >= 80 in | Wt 337.6 lb

## 2023-06-20 DIAGNOSIS — E1159 Type 2 diabetes mellitus with other circulatory complications: Secondary | ICD-10-CM

## 2023-06-20 DIAGNOSIS — Z7985 Long-term (current) use of injectable non-insulin antidiabetic drugs: Secondary | ICD-10-CM

## 2023-06-20 DIAGNOSIS — E1169 Type 2 diabetes mellitus with other specified complication: Secondary | ICD-10-CM

## 2023-06-20 DIAGNOSIS — E781 Pure hyperglyceridemia: Secondary | ICD-10-CM

## 2023-06-20 MED ORDER — LISINOPRIL 5 MG PO TABS: 5.0000 mg | ORAL_TABLET | Freq: Every day | ORAL | 1 refills | Status: DC

## 2023-06-20 MED ORDER — METFORMIN HCL 500 MG PO TABS: ORAL_TABLET | ORAL | 1 refills | Status: DC

## 2023-06-20 MED ORDER — ROSUVASTATIN CALCIUM 10 MG PO TABS: 10.0000 mg | ORAL_TABLET | Freq: Every day | ORAL | 1 refills | Status: DC

## 2023-06-20 MED ORDER — FENOFIBRATE 145 MG PO TABS: 145.0000 mg | ORAL_TABLET | Freq: Every day | ORAL | 1 refills | Status: DC

## 2023-06-20 MED ORDER — OZEMPIC (0.25 OR 0.5 MG/DOSE) 2 MG/3ML ~~LOC~~ SOPN: 0.5000 mg | PEN_INJECTOR | SUBCUTANEOUS | 1 refills | Status: DC

## 2023-06-20 NOTE — Assessment & Plan Note (Signed)
Chronic.  Controlled.  Continue with current medication regimen of Metformin and ozempic.  Continue Lisinopril 5mg  for kidney protection.  Microalbumin ordered today.  Last A1c was 6.5%.  Continue Crestor to help with Cardiac protection.  Side effects and benefits of medication discussed during visit.  Labs ordered today.  Return to clinic in 6 months for reevaluation.  Call sooner if concerns arise.

## 2023-06-20 NOTE — Assessment & Plan Note (Signed)
Chronic.  Controlled.  Continue with current medication regimen of Fenofibrate.  Increase Rosuvastatin to 10mg  daily.  Labs ordered today.  Return to clinic in 6 months for reevaluation.  Call sooner if concerns arise.

## 2023-06-20 NOTE — Progress Notes (Signed)
BP 117/75 (BP Location: Right Arm, Patient Position: Sitting, Cuff Size: Large)   Pulse 84   Temp 98.3 F (36.8 C) (Oral)   Ht 6\' 8"  (2.032 m)   Wt (!) 337 lb 9.6 oz (153.1 kg)   SpO2 97%   BMI 37.09 kg/m    Subjective:    Patient ID: Collin Butler, male    DOB: 1986-02-14, 37 y.o.   MRN: [id number]  HPI: Collin Butler is a 37 y.o. male  Chief Complaint  Patient presents with   2 month follow up   Medication Management    Ozempic, time to titrate up, having no concerns    Insurance Coordination    Has a change in insurance and would like to know if a referral is needed    Diabetes   DIABETES Doing well with metformin.  On Ozempic 0.25mg .  Updated on his eye exam. Last A1c is 6.5%. See's a Nutritionist. Hypoglycemic episodes:no Polydipsia/polyuria: no Visual disturbance: no Chest pain: no Paresthesias: no Glucose Monitoring: no  Accucheck frequency: Not Checking  Fasting glucose:  Post prandial:  Evening:  Before meals: Taking Insulin?: no  Long acting insulin:  Short acting insulin: Blood Pressure Monitoring: not checking Retinal Examination: Up to Date Foot Exam: Up to Date Diabetic Education: Not Completed Pneumovax: Up to Date Influenza: Up to Date Aspirin: no  HYPERLIPIDEMIA Hyperlipidemia status: excellent compliance Satisfied with current treatment?  yes Side effects:  no Medication compliance: excellent compliance Past cholesterol meds: fenofibrate (tricor) Supplements: none Aspirin:  no The ASCVD Risk score (Arnett DK, et al., 2019) failed to calculate for the following reasons:   The 2019 ASCVD risk score is only valid for ages 80 to 19 Chest pain:  no Coronary artery disease:  no Family history CAD:  no Family history early CAD:  no   SLEEP APNEA Doing well with CPAP.  Sleep apnea status: controlled Duration: months Satisfied with current treatment?:  yes CPAP use:  yes Sleep quality with CPAP use: excellent Treament  compliance:excellent compliance Last sleep study: Prior to 2020 Treatments attempted: CPAP Wakes feeling refreshed:  yes Daytime hypersomnolence:  no Fatigue:  no Insomnia:  no Good sleep hygiene:  yes Difficulty falling asleep:  no Difficulty staying asleep:  no Snoring bothers bed partner:  no Observed apnea by bed partner: no Obesity:  yes Hypertension: no  Pulmonary hypertension:  no Coronary artery disease:  no   Relevant past medical, surgical, family and social history reviewed and updated as indicated. Interim medical history since our last visit reviewed. Allergies and medications reviewed and updated.  Review of Systems  Eyes:  Negative for visual disturbance.  Respiratory:  Negative for chest tightness and shortness of breath.   Cardiovascular:  Negative for chest pain, palpitations and leg swelling.  Endocrine: Negative for polydipsia and polyuria.  Neurological:  Negative for dizziness, light-headedness, numbness and headaches.    Per HPI unless specifically indicated above     Objective:    BP 117/75 (BP Location: Right Arm, Patient Position: Sitting, Cuff Size: Large)   Pulse 84   Temp 98.3 F (36.8 C) (Oral)   Ht 6\' 8"  (2.032 m)   Wt (!) 337 lb 9.6 oz (153.1 kg)   SpO2 97%   BMI 37.09 kg/m   Wt Readings from Last 3 Encounters:  06/20/23 (!) 337 lb 9.6 oz (153.1 kg)  04/17/23 (!) 335 lb (152 kg)  04/06/23 (!) 333 lb 3.2 oz (151.1 kg)    Physical Exam  Vitals and nursing note reviewed.  Constitutional:      General: He is not in acute distress.    Appearance: Normal appearance. He is obese. He is not ill-appearing, toxic-appearing or diaphoretic.  HENT:     Head: Normocephalic.     Right Ear: External ear normal.     Left Ear: External ear normal.     Nose: Nose normal. No congestion or rhinorrhea.     Mouth/Throat:     Mouth: Mucous membranes are moist.  Eyes:     General:        Right eye: No discharge.        Left eye: No discharge.      Extraocular Movements: Extraocular movements intact.     Conjunctiva/sclera: Conjunctivae normal.     Pupils: Pupils are equal, round, and reactive to light.  Cardiovascular:     Rate and Rhythm: Normal rate and regular rhythm.     Heart sounds: No murmur heard. Pulmonary:     Effort: Pulmonary effort is normal. No respiratory distress.     Breath sounds: Normal breath sounds. No wheezing, rhonchi or rales.  Abdominal:     General: Abdomen is flat. Bowel sounds are normal.  Musculoskeletal:     Cervical back: Normal range of motion and neck supple.  Skin:    General: Skin is warm and dry.     Capillary Refill: Capillary refill takes less than 2 seconds.  Neurological:     General: No focal deficit present.     Mental Status: He is alert and oriented to person, place, and time.  Psychiatric:        Mood and Affect: Mood normal.        Behavior: Behavior normal.        Thought Content: Thought content normal.        Judgment: Judgment normal.     Results for orders placed or performed in visit on 04/06/23  Comp Met (CMET)   Collection Time: 04/06/23 10:20 AM  Result Value Ref Range   Glucose 173 (H) 70 - 99 mg/dL   BUN 12 6 - 20 mg/dL   Creatinine, Ser 3.87 0.76 - 1.27 mg/dL   eGFR 84 >56 EP/PIR/5.18   BUN/Creatinine Ratio 10 9 - 20   Sodium 139 134 - 144 mmol/L   Potassium 4.4 3.5 - 5.2 mmol/L   Chloride 99 96 - 106 mmol/L   CO2 26 20 - 29 mmol/L   Calcium 10.0 8.7 - 10.2 mg/dL   Total Protein 7.6 6.0 - 8.5 g/dL   Albumin 4.7 4.1 - 5.1 g/dL   Globulin, Total 2.9 1.5 - 4.5 g/dL   Bilirubin Total 0.3 0.0 - 1.2 mg/dL   Alkaline Phosphatase 82 44 - 121 IU/L   AST 96 (H) 0 - 40 IU/L   ALT 116 (H) 0 - 44 IU/L  Lipid Profile   Collection Time: 04/06/23 10:20 AM  Result Value Ref Range   Cholesterol, Total 127 100 - 199 mg/dL   Triglycerides 841 (H) 0 - 149 mg/dL   HDL 37 (L) >66 mg/dL   VLDL Cholesterol Cal 39 5 - 40 mg/dL   LDL Chol Calc (NIH) 51 0 - 99 mg/dL    Chol/HDL Ratio 3.4 0.0 - 5.0 ratio  Microalbumin, Urine Waived (STAT)   Collection Time: 04/06/23 12:22 PM  Result Value Ref Range   Microalb, Ur Waived 150 (H) 0 - 19 mg/L   Creatinine, Urine Waived 300 10 -  300 mg/dL   Microalb/Creat Ratio 30-300 (H) <30 mg/g      Assessment & Plan:   Problem List Items Addressed This Visit       Cardiovascular and Mediastinum   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  Controlled.  Continue with current medication regimen of Metformin and ozempic.  Continue Lisinopril 5mg  for kidney protection.  Microalbumin ordered today.  Last A1c was 6.5%.  Continue Crestor to help with Cardiac protection.  Side effects and benefits of medication discussed during visit.  Labs ordered today.  Return to clinic in 6 months for reevaluation.  Call sooner if concerns arise.       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  Controlled.  Continue with current medication regimen of Fenofibrate.  Increase Rosuvastatin to 10mg  daily.  Labs ordered today.  Return to clinic in 6 months for reevaluation.  Call sooner if concerns arise.        Relevant Medications   fenofibrate (TRICOR) 145 MG tablet   lisinopril (ZESTRIL) 5 MG tablet   metFORMIN (GLUCOPHAGE) 500 MG tablet   rosuvastatin (CRESTOR) 10 MG tablet   Semaglutide,0.25 or 0.5MG /DOS, (OZEMPIC, 0.25 OR 0.5 MG/DOSE,) 2 MG/3ML SOPN   Other Relevant Orders   Lipid Profile     Follow up plan: Return in about 6 months (around 12/19/2023) for Physical and Fasting labs.

## 2023-06-21 LAB — COMPREHENSIVE METABOLIC PANEL
ALT: 122 [IU]/L — ABNORMAL HIGH (ref 0–44)
AST: 102 [IU]/L — ABNORMAL HIGH (ref 0–40)
Albumin: 4.4 g/dL (ref 4.1–5.1)
Alkaline Phosphatase: 73 [IU]/L (ref 44–121)
BUN/Creatinine Ratio: 11 (ref 9–20)
BUN: 11 mg/dL (ref 6–20)
Bilirubin Total: 0.4 mg/dL (ref 0.0–1.2)
CO2: 23 mmol/L (ref 20–29)
Calcium: 9.9 mg/dL (ref 8.7–10.2)
Chloride: 98 mmol/L (ref 96–106)
Creatinine, Ser: 1.04 mg/dL (ref 0.76–1.27)
Globulin, Total: 3.2 g/dL (ref 1.5–4.5)
Glucose: 231 mg/dL — ABNORMAL HIGH (ref 70–99)
Potassium: 4.3 mmol/L (ref 3.5–5.2)
Sodium: 135 mmol/L (ref 134–144)
Total Protein: 7.6 g/dL (ref 6.0–8.5)
eGFR: 95 mL/min/{1.73_m2} (ref >59)

## 2023-06-21 LAB — HEMOGLOBIN A1C
Est. average glucose Bld gHb Est-mCnc: 166 mg/dL
Hgb A1c MFr Bld: 7.4 % — ABNORMAL HIGH (ref 4.8–5.6)

## 2023-06-21 LAB — LIPID PANEL
Chol/HDL Ratio: 3.1 {ratio} (ref 0.0–5.0)
Cholesterol, Total: 115 mg/dL (ref 100–199)
HDL: 37 mg/dL — ABNORMAL LOW (ref >39)
LDL Chol Calc (NIH): 47 mg/dL (ref 0–99)
Triglycerides: 191 mg/dL — ABNORMAL HIGH (ref 0–149)
VLDL Cholesterol Cal: 31 mg/dL (ref 5–40)

## 2023-06-28 ENCOUNTER — Encounter: Payer: Self-pay | Admitting: Urology

## 2023-06-28 ENCOUNTER — Ambulatory Visit (INDEPENDENT_AMBULATORY_CARE_PROVIDER_SITE_OTHER): Payer: Self-pay | Admitting: Urology

## 2023-06-28 VITALS — BP 142/75 | HR 90 | Ht >= 80 in | Wt 330.0 lb

## 2023-06-28 DIAGNOSIS — Q984 Klinefelter syndrome, unspecified: Secondary | ICD-10-CM

## 2023-06-28 DIAGNOSIS — E291 Testicular hypofunction: Secondary | ICD-10-CM

## 2023-06-28 NOTE — Progress Notes (Signed)
I, Maysun Anabel Bene, acting as a scribe for Riki Altes, MD., have documented all relevant documentation on the behalf of Riki Altes, MD, as directed by Riki Altes, MD while in the presence of Riki Altes, MD.  06/28/2023 2:56 PM   Collin Butler 09-01-1985 [id number]  Referring provider: Larae Grooms, NP [address] [place],  [place] [address]  Urologic history: 1.  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 male presents for annual follow-up.  Since his last visit, he lost his insurance October/November. His Barbaraann Cao was denied as he had not had recent evidence of low levels since he has been on replacement.  He chose to pay out of pocket and is currently out. Has been 2 weeks since his last injection.  He is hoping to get on his wife's insurance after the first of the year. Labs June 2024 testosterone was 443, hematocrit was 43.3   PMH: Past Medical History:  Diagnosis Date   Asthma    Hypogonadism in male    Klinefelter syndrome    Peripheral vascular disease (HCC)    Sleep apnea     Surgical History: Past Surgical History:  Procedure Laterality Date   NO PAST SURGERIES      Home Medications:  Allergies as of 06/28/2023       Reactions   Avocado Itching   Banana Itching        Medication List        Accurate as of June 28, 2023  2:56 PM. If you have any questions, ask your nurse or doctor.          fenofibrate 145 MG tablet Commonly known as: TRICOR Take 1 tablet (145 mg total) by mouth daily.   levocetirizine 5 MG tablet Commonly known as: XYZAL Take 5 mg by mouth every evening.   lisinopril 5 MG tablet Commonly known as: ZESTRIL Take 1 tablet (5 mg total) by mouth daily.   Luer Lock Safety Syringes 21G X 1-1/2" 3 ML Misc Generic drug: SYRINGE-NEEDLE (DISP) 3 ML Use as directed for testosterone administration   metFORMIN 500 MG tablet Commonly known  as: GLUCOPHAGE TAKE 1 TAB IN THE AM FOR 2 WEEKS AND THEN INCREASE TO 1 TAB TWICE A DAY   MULTIVITAMIN MEN PO Take by mouth.   NEEDLE (DISP) 18 G 18G X 1" Misc Use as directed   Ozempic (0.25 or 0.5 MG/DOSE) 2 MG/3ML Sopn Generic drug: Semaglutide(0.25 or 0.5MG /DOS) Inject 0.5 mg into the skin once a week.   prednisoLONE acetate 1 % ophthalmic suspension Commonly known as: PRED FORTE 1 drop 3 (three) times daily.   rosuvastatin 10 MG tablet Commonly known as: CRESTOR Take 1 tablet (10 mg total) by mouth daily.   Xyosted 100 MG/0.5ML Soaj Generic drug: Testosterone Enanthate INJECT 1 PEN SUBCUTANEOUSLY ONCE EVERY WEEK        Allergies:  Allergies  Allergen Reactions   Avocado Itching   Banana Itching    Family History: Family History  Problem Relation Age of Onset   Diabetes type I Sister    Cancer Maternal Grandmother    Hypertension Maternal Grandfather    Prostate cancer Neg Hx    Bladder Cancer Neg Hx    Kidney cancer Neg Hx     Social History:  reports that he has never smoked. He has never used smokeless tobacco. He reports that he does not currently use  alcohol. He reports that he does not use drugs.   Physical Exam: BP (!) 142/75   Pulse 90   Ht 6\' 8"  (2.032 m)   Wt (!) 330 lb (149.7 kg)   BMI 36.25 kg/m   Constitutional:  Alert and oriented, No acute distress. HEENT: [place] AT Respiratory: Normal respiratory effort, no increased work of breathing. Psychiatric: Normal mood and affect.   Assessment & Plan:    1. 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  We discussed starting IM injections until he can see if his new insurance company will cover his Barbaraann Cao after the first of the year.  We also discussed the possibility of compounded testosterone in grapeseed oil which can be injected subcutaneously.   I have reviewed the above documentation for accuracy and  completeness, and I agree with the above.   Riki Altes, MD  [facility] [address] [place], [place] [address] [phone]

## 2023-07-05 ENCOUNTER — Telehealth: Payer: Self-pay | Admitting: Urology

## 2023-07-05 ENCOUNTER — Telehealth: Payer: Self-pay | Admitting: Nurse Practitioner

## 2023-07-05 ENCOUNTER — Telehealth: Payer: Self-pay | Admitting: *Deleted

## 2023-07-05 NOTE — Telephone Encounter (Signed)
 Patient states he would like to try the regular testosterone again  because his new insurance dont cover xyosted. Also needle and syringe to the pharmacy [facility] [place] too

## 2023-07-05 NOTE — Telephone Encounter (Signed)
 His new insurance does not cover his testosterone shots.

## 2023-07-05 NOTE — Telephone Encounter (Signed)
 Copied from CRM [id number]. Topic: General - Other >> Jul 05, 2023  3:18 PM Turkey B wrote: Reason for CRM: pt called in with new insurance, [employer]#[id number]/ group [id number]

## 2023-07-05 NOTE — Telephone Encounter (Signed)
 Called patient and sent a message to DR. Stoioff

## 2023-07-06 NOTE — Telephone Encounter (Signed)
 Insurance ID # has been noted

## 2023-07-09 ENCOUNTER — Other Ambulatory Visit: Payer: Self-pay | Admitting: Nurse Practitioner

## 2023-07-09 DIAGNOSIS — E1169 Type 2 diabetes mellitus with other specified complication: Secondary | ICD-10-CM

## 2023-07-09 NOTE — Telephone Encounter (Signed)
 Medication Refill -  Most Recent Primary Care Visit:  Provider: MELVIN PAO  Department: [facility]  Visit Type: OFFICE VISIT Date: 06/20/2023  Medication:  Semaglutide ,0.25 or 0.5MG /DOS, (OZEMPIC , 0.25 OR 0.5 MG/DOSE,) 2MG /3ML SOPN rosuvastatin  (CRESTOR ) 10 MG tablet metFORMIN  (GLUCOPHAGE ) 500 MG tablet - has enough just needs prescription transferred to updated pharmacy lisinopril  (ZESTRIL ) 5 MG tablet fenofibrate  (TRICOR ) 145 MG tablet Pt did not have insurance at time of last fill, 06/20/2023. Pt needing prescriptions to be sent to pharmacy below.  Has the patient contacted their pharmacy? Yes  Is this the correct pharmacy for this prescription? Yes  This is the patient's preferred pharmacy:  [facility] - [place], [place] - [address] [place] [address] Phone: [phone] Fax: [phone]   Has the prescription been filled recently? Yes  Is the patient out of the medication? No  Has the patient been seen for an appointment in the last year OR does the patient have an upcoming appointment? Yes  Can we respond through MyChart? Yes  Agent: Please be advised that Rx refills may take up to 3 business days. We ask that you follow-up with your pharmacy.

## 2023-07-11 MED ORDER — ROSUVASTATIN CALCIUM 10 MG PO TABS: 10.0000 mg | ORAL_TABLET | Freq: Every day | ORAL | 1 refills | Status: DC

## 2023-07-11 MED ORDER — METFORMIN HCL 500 MG PO TABS: ORAL_TABLET | ORAL | 1 refills | Status: DC

## 2023-07-11 MED ORDER — LISINOPRIL 5 MG PO TABS: 5.0000 mg | ORAL_TABLET | Freq: Every day | ORAL | 1 refills | Status: DC

## 2023-07-11 MED ORDER — OZEMPIC (0.25 OR 0.5 MG/DOSE) 2 MG/3ML ~~LOC~~ SOPN: 0.5000 mg | PEN_INJECTOR | SUBCUTANEOUS | 1 refills | Status: DC

## 2023-07-11 MED ORDER — FENOFIBRATE 145 MG PO TABS: 145.0000 mg | ORAL_TABLET | Freq: Every day | ORAL | 1 refills | Status: DC

## 2023-07-11 NOTE — Telephone Encounter (Signed)
 Requested Prescriptions  Pending Prescriptions Disp Refills   Semaglutide ,0.25 or 0.5MG /DOS, (OZEMPIC , 0.25 OR 0.5 MG/DOSE,) 2 MG/3ML SOPN 6 mL 1    Sig: Inject 0.5 mg into the skin once a week.     Endocrinology:  Diabetes - GLP-1 Receptor Agonists - semaglutide  Failed - 07/11/2023  2:57 PM      Failed - HBA1C in normal range and within 180 days    Hgb A1c MFr Bld  Date Value Ref Range Status  06/20/2023 7.4 (H) 4.8 - 5.6 % Final    Comment:             Prediabetes: 5.7 - 6.4          Diabetes: >6.4          Glycemic control for adults with diabetes: <7.0          Passed - Cr in normal range and within 360 days    Creatinine, Ser  Date Value Ref Range Status  06/20/2023 1.04 0.76 - 1.27 mg/dL Final         Passed - Valid encounter within last 6 months    Recent Outpatient Visits           3 weeks ago Type 2 diabetes mellitus with other circulatory complication, without long-term current use of insulin (HCC)   [place] [facility] Melvin Pao, NP   3 months ago Type 2 diabetes mellitus with hypertriglyceridemia [facility])   [place] [facility] Herold Hadassah SQUIBB, MD   9 months ago Chronic venous insufficiency   [place] [facility] Melvin Pao, NP   1 year ago Annual physical exam   [place] [facility] Melvin Pao, NP   1 year ago Hypertriglyceridemia   [place] [facility] Melvin Pao, NP       Future Appointments             In 5 months Melvin Pao, NP [place] [facility]             rosuvastatin  (CRESTOR ) 10 MG tablet 90 tablet 1    Sig: Take 1 tablet (10 mg total) by mouth daily.     Cardiovascular:  Antilipid - Statins 2 Failed - 07/11/2023  2:57 PM      Failed - Lipid Panel in normal range within the last 12 months    Cholesterol, Total  Date Value Ref Range Status  06/20/2023 115 100 - 199 mg/dL Final   LDL Chol Calc (NIH)  Date  Value Ref Range Status  06/20/2023 47 0 - 99 mg/dL Final   HDL  Date Value Ref Range Status  06/20/2023 37 (L) >39 mg/dL Final   Triglycerides  Date Value Ref Range Status  06/20/2023 191 (H) 0 - 149 mg/dL Final         Passed - Cr in normal range and within 360 days    Creatinine, Ser  Date Value Ref Range Status  06/20/2023 1.04 0.76 - 1.27 mg/dL Final         Passed - Patient is not pregnant      Passed - Valid encounter within last 12 months    Recent Outpatient Visits           3 weeks ago Type 2 diabetes mellitus with other circulatory complication, without long-term current use of insulin [facility])   [place] [facility], Pao, NP   3 months ago Type 2 diabetes  mellitus with hypertriglyceridemia (HCC)   [place] [facility] Herold Hadassah SQUIBB, MD   9 months ago Chronic venous insufficiency   [place] [facility] Melvin Pao, NP   1 year ago Annual physical exam   [place] [facility] Melvin Pao, NP   1 year ago Hypertriglyceridemia   [place] [facility] Melvin Pao, NP       Future Appointments             In 5 months Melvin Pao, NP [place] [facility]             metFORMIN  (GLUCOPHAGE ) 500 MG tablet 180 tablet 1    Sig: TAKE 1 TAB IN THE AM FOR 2 WEEKS AND THEN INCREASE TO 1 TAB TWICE A DAY     Endocrinology:  Diabetes - Biguanides Failed - 07/11/2023  2:57 PM      Failed - B12 Level in normal range and within 720 days    No results found for: VITAMINB12       Failed - CBC within normal limits and completed in the last 12 months    WBC  Date Value Ref Range Status  04/06/2022 7.5 3.4 - 10.8 x10E3/uL Final   RBC  Date Value Ref Range Status  04/06/2022 5.93 (H) 4.14 - 5.80 x10E6/uL Final   Hemoglobin  Date Value Ref Range Status  04/06/2022 15.1 13.0 - 17.7 g/dL Final   Hematocrit  Date Value Ref Range Status   12/07/2022 43.3 37.5 - 51.0 % Final   MCHC  Date Value Ref Range Status  04/06/2022 31.8 31.5 - 35.7 g/dL Final   [facility]  Date Value Ref Range Status  04/06/2022 25.5 (L) 26.6 - 33.0 pg Final   MCV  Date Value Ref Range Status  04/06/2022 80 79 - 97 fL Final   No results found for: PLTCOUNTKUC, LABPLAT, POCPLA RDW  Date Value Ref Range Status  04/06/2022 15.0 11.6 - 15.4 % Final         Passed - Cr in normal range and within 360 days    Creatinine, Ser  Date Value Ref Range Status  06/20/2023 1.04 0.76 - 1.27 mg/dL Final         Passed - HBA1C is between 0 and 7.9 and within 180 days    Hgb A1c MFr Bld  Date Value Ref Range Status  06/20/2023 7.4 (H) 4.8 - 5.6 % Final    Comment:             Prediabetes: 5.7 - 6.4          Diabetes: >6.4          Glycemic control for adults with diabetes: <7.0          Passed - eGFR in normal range and within 360 days    GFR calc Af Amer  Date Value Ref Range Status  04/01/2020 110 >59 mL/min/1.73 Final    Comment:    **[facility] currently reports eGFR in compliance with the current**   recommendations of the [employer]. [facility] will   update reporting as new guidelines are published from the NKF-ASN   Task force.    GFR calc non Af Amer  Date Value Ref Range Status  04/01/2020 95 >59 mL/min/1.73 Final   eGFR  Date Value Ref Range Status  06/20/2023 95 >59 mL/min/1.73 Final         Passed - Valid encounter within  last 6 months    Recent Outpatient Visits           3 weeks ago Type 2 diabetes mellitus with other circulatory complication, without long-term current use of insulin (HCC)   [place] [facility], Darice, NP   3 months ago Type 2 diabetes mellitus with hypertriglyceridemia [facility])   [place] [facility] Herold Hadassah SQUIBB, MD   9 months ago Chronic venous insufficiency   [place] [facility] Melvin Darice, NP   1 year ago Annual  physical exam   [place] [facility] Melvin Darice, NP   1 year ago Hypertriglyceridemia   [place] [facility] Melvin Darice, NP       Future Appointments             In 5 months Melvin Darice, NP [place] [facility]             lisinopril  (ZESTRIL ) 5 MG tablet 90 tablet 1    Sig: Take 1 tablet (5 mg total) by mouth daily.     Cardiovascular:  ACE Inhibitors Failed - 07/11/2023  2:57 PM      Failed - Last BP in normal range    BP Readings from Last 1 Encounters:  06/28/23 (!) 142/75         Passed - Cr in normal range and within 180 days    Creatinine, Ser  Date Value Ref Range Status  06/20/2023 1.04 0.76 - 1.27 mg/dL Final         Passed - K in normal range and within 180 days    Potassium  Date Value Ref Range Status  06/20/2023 4.3 3.5 - 5.2 mmol/L Final         Passed - Patient is not pregnant      Passed - Valid encounter within last 6 months    Recent Outpatient Visits           3 weeks ago Type 2 diabetes mellitus with other circulatory complication, without long-term current use of insulin (HCC)   [place] [facility], Darice, NP   3 months ago Type 2 diabetes mellitus with hypertriglyceridemia [facility])   [place] [facility] Herold Hadassah SQUIBB, MD   9 months ago Chronic venous insufficiency   [place] [facility] Melvin Darice, NP   1 year ago Annual physical exam   [place] [facility] Melvin Darice, NP   1 year ago Hypertriglyceridemia   [place] [facility] Melvin Darice, NP       Future Appointments             In 5 months Melvin Darice, NP  [facility]             fenofibrate  (TRICOR ) 145 MG tablet 90 tablet 1    Sig: Take 1 tablet (145 mg total) by mouth daily.     Cardiovascular:  Antilipid - Fibric Acid Derivatives Failed - 07/11/2023  2:57  PM      Failed - ALT in normal range and within 360 days    ALT  Date Value Ref Range Status  06/20/2023 122 (H) 0 - 44 IU/L Final         Failed - AST in normal range and within 360 days    AST  Date Value Ref Range Status  06/20/2023 102 (H) 0 - 40 IU/L Final  Failed - HGB in normal range and within 360 days    Hemoglobin  Date Value Ref Range Status  04/06/2022 15.1 13.0 - 17.7 g/dL Final         Failed - PLT in normal range and within 360 days    Platelets  Date Value Ref Range Status  04/06/2022 254 150 - 450 x10E3/uL Final         Failed - WBC in normal range and within 360 days    WBC  Date Value Ref Range Status  04/06/2022 7.5 3.4 - 10.8 x10E3/uL Final         Failed - Lipid Panel in normal range within the last 12 months    Cholesterol, Total  Date Value Ref Range Status  06/20/2023 115 100 - 199 mg/dL Final   LDL Chol Calc (NIH)  Date Value Ref Range Status  06/20/2023 47 0 - 99 mg/dL Final   HDL  Date Value Ref Range Status  06/20/2023 37 (L) >39 mg/dL Final   Triglycerides  Date Value Ref Range Status  06/20/2023 191 (H) 0 - 149 mg/dL Final         Passed - Cr in normal range and within 360 days    Creatinine, Ser  Date Value Ref Range Status  06/20/2023 1.04 0.76 - 1.27 mg/dL Final         Passed - HCT in normal range and within 360 days    Hematocrit  Date Value Ref Range Status  12/07/2022 43.3 37.5 - 51.0 % Final         Passed - eGFR is 30 or above and within 360 days    GFR calc Af Amer  Date Value Ref Range Status  04/01/2020 110 >59 mL/min/1.73 Final    Comment:    **[facility] currently reports eGFR in compliance with the current**   recommendations of the [employer]. [facility] will   update reporting as new guidelines are published from the NKF-ASN   Task force.    GFR calc non Af Amer  Date Value Ref Range Status  04/01/2020 95 >59 mL/min/1.73 Final   eGFR  Date Value Ref Range Status  06/20/2023  95 >59 mL/min/1.73 Final         Passed - Valid encounter within last 12 months    Recent Outpatient Visits           3 weeks ago Type 2 diabetes mellitus with other circulatory complication, without long-term current use of insulin (HCC)   [place], Darice, NP   3 months ago Type 2 diabetes mellitus with hypertriglyceridemia [facility])   [place] [facility] Herold Hadassah SQUIBB, MD   9 months ago Chronic venous insufficiency   [place] [facility] Melvin Darice, NP   1 year ago Annual physical exam   [place] [facility] Melvin Darice, NP   1 year ago Hypertriglyceridemia   [place] [facility] Melvin Darice, NP       Future Appointments             In 5 months Melvin Darice, NP [place] [facility]

## 2023-07-12 ENCOUNTER — Telehealth: Payer: Self-pay

## 2023-07-12 NOTE — Telephone Encounter (Signed)
 PA for Ozempic initiated and submitted via Cover My Meds. Key: [place] Sender

## 2023-07-16 ENCOUNTER — Encounter: Payer: Self-pay | Admitting: *Deleted

## 2023-07-16 NOTE — Telephone Encounter (Signed)
 PA approved.

## 2023-07-16 NOTE — Telephone Encounter (Signed)
 Patient called and he has new insurance which is [employer]. They do not cover his XYOSTED  100mg  and he would like something else called in to replace that medication. He does have a new pharmacy. It is [facility]. It is in his chart. It is the one that is at that hospital.

## 2023-07-16 NOTE — Telephone Encounter (Signed)
 Sent to patient in his my chart

## 2023-07-19 ENCOUNTER — Other Ambulatory Visit: Payer: Self-pay | Admitting: Urology

## 2023-07-19 ENCOUNTER — Encounter: Payer: Self-pay | Admitting: *Deleted

## 2023-07-19 MED ORDER — TESTOSTERONE CYPIONATE 200 MG/ML IM SOLN: 200.0000 mg | INTRAMUSCULAR | 0 refills | Status: DC

## 2023-07-19 NOTE — Telephone Encounter (Signed)
Sent mychart message

## 2023-07-27 ENCOUNTER — Ambulatory Visit: Payer: PRIVATE HEALTH INSURANCE | Admitting: Physician Assistant

## 2023-07-27 VITALS — BP 121/80 | HR 74 | Ht >= 80 in | Wt 350.0 lb

## 2023-07-27 DIAGNOSIS — E291 Testicular hypofunction: Secondary | ICD-10-CM | POA: Diagnosis not present

## 2023-07-27 NOTE — Patient Instructions (Addendum)
Supplies needed: -Testosterone from your pharmacy -Alcohol swabs -18G Luer Lock needles to draw up the medicine -21G Luer Lock needles to inject the medicine -3cc Luer Lock syringes -Bandaids or gauze pads -Optional: Transport planner      Instructions for disposing of sharps:  Disposal of syringes and other sharp objects is monitored by the Dietitian (EPA). It is important to dispose of them properly for your safety and for the safety of others.  The EPA promotes all recycling activities, and therefore encourages you to discard medical waste sharps in sturdy, non-recyclable containers, when possible.  Your stat or community environmental programs may have other requirements or suggestions for disposing of your medical waste.  You should contact your local EPA office for any information you may need.  What container should be used Place needles, syringes, lancets and other sharp objects in a hard plastic or metal container with a screw on or tightly secured lid.  Many containers found in the household will do, or you may purchase containers specifically designed for disposal of medical wast sharps.  If a recyclable container is used to dispose of medical waste sharps, make sure that you don't mix the container with other materials to be recycled.  Since the sharps impair a containers recyclability, a container holding your medical waste sharps properly belongs with the regular household trash.  You should label the container "Not for Recycling".  In addition, make sure your sharps container is made of non breakable material and has a lid that can be securely closed (screwed on or tightly secured).  Before discarding a container, be sure to reinforce the lid with heavy-duty tape.  Do not put sharpe objects in a container you plan to recycle or return to a store, and do not use glass or clear plastic containers (see additional information below).  Finally, make sure that you  keep all containers with sharp objects out of the reach of children and pets.  Your home care provider may deliver a sharps container with your medical supplies.  If so place all needles, syringes and lancets in this container and notify the company when the container is approximately 75% full.  Your home care provider will arrange for pickup of the container.  For your safety, do NOT bring your container to the hospital for disposal.        Tips for minimizing injection pain- -inject medicine that is at room temperature -remove all air bubbles from the syringe before injection -wait until the topical alcohol has evaporated before injecting -keep muscles in the injection area relaxed -break through the skin quickly -don't change the direction of the needle as it goes in or comes out -do not reuse disposable needles

## 2023-07-27 NOTE — Progress Notes (Signed)
Patient presents today for Testosterone injection teaching. Patient was instructed on how to properly use the 18guage needle to draw up 1cc of the testosterone, into 3cc syringe then changed the needle to the 21guage for injection. I then cleaned the vastus lateralis with an alcohol swab and injected the site with bevel up.   Patient dose: 200mg  (1mL) Lot Number: [id number] Expiration date: 08/2025 Location: Right  Patient verbalized understanding current dose is 1ml every 14days unless instructed by a provider.  Patient tolerated well.  Patient understood how to dispose of sharps properly and store medication.   Performed by: Carman Ching, PA-C

## 2023-07-28 LAB — TESTOSTERONE: Testosterone: 80 ng/dL — ABNORMAL LOW (ref 264–916)

## 2023-09-17 ENCOUNTER — Other Ambulatory Visit: Payer: Self-pay

## 2023-09-17 DIAGNOSIS — E291 Testicular hypofunction: Secondary | ICD-10-CM

## 2023-09-17 DIAGNOSIS — Q984 Klinefelter syndrome, unspecified: Secondary | ICD-10-CM

## 2023-09-24 ENCOUNTER — Other Ambulatory Visit: Payer: PRIVATE HEALTH INSURANCE

## 2023-09-24 DIAGNOSIS — Q984 Klinefelter syndrome, unspecified: Secondary | ICD-10-CM

## 2023-09-24 DIAGNOSIS — E291 Testicular hypofunction: Secondary | ICD-10-CM

## 2023-09-25 LAB — PSA: Prostate Specific Ag, Serum: 0.6 ng/mL (ref 0.0–4.0)

## 2023-09-25 LAB — HEMOGLOBIN AND HEMATOCRIT, BLOOD
Hematocrit: 41.2 % (ref 37.5–51.0)
Hemoglobin: 12.8 g/dL — ABNORMAL LOW (ref 13.0–17.7)

## 2023-09-25 LAB — TESTOSTERONE: Testosterone: 630 ng/dL (ref 264–916)

## 2023-10-16 ENCOUNTER — Ambulatory Visit: Payer: Managed Care, Other (non HMO) | Admitting: Dietician

## 2023-10-24 ENCOUNTER — Other Ambulatory Visit: Payer: Self-pay | Admitting: Urology

## 2023-12-12 ENCOUNTER — Encounter: Payer: Self-pay | Admitting: Nurse Practitioner

## 2023-12-12 ENCOUNTER — Ambulatory Visit (INDEPENDENT_AMBULATORY_CARE_PROVIDER_SITE_OTHER): Payer: PRIVATE HEALTH INSURANCE | Admitting: Nurse Practitioner

## 2023-12-12 VITALS — BP 116/73 | HR 70 | Temp 98.4°F | Resp 17 | Ht >= 80 in | Wt 331.8 lb

## 2023-12-12 DIAGNOSIS — E781 Pure hyperglyceridemia: Secondary | ICD-10-CM

## 2023-12-12 DIAGNOSIS — E1169 Type 2 diabetes mellitus with other specified complication: Secondary | ICD-10-CM

## 2023-12-12 DIAGNOSIS — E1159 Type 2 diabetes mellitus with other circulatory complications: Secondary | ICD-10-CM

## 2023-12-12 DIAGNOSIS — Z0000 Encounter for general adult medical examination without abnormal findings: Secondary | ICD-10-CM | POA: Diagnosis not present

## 2023-12-12 MED ORDER — METFORMIN HCL 500 MG PO TABS: ORAL_TABLET | ORAL | 1 refills | Status: DC

## 2023-12-12 MED ORDER — ROSUVASTATIN CALCIUM 10 MG PO TABS: 10.0000 mg | ORAL_TABLET | Freq: Every day | ORAL | 1 refills | Status: DC

## 2023-12-12 MED ORDER — OZEMPIC (0.25 OR 0.5 MG/DOSE) 2 MG/3ML ~~LOC~~ SOPN: 0.5000 mg | PEN_INJECTOR | SUBCUTANEOUS | 1 refills | Status: DC

## 2023-12-12 MED ORDER — FENOFIBRATE 145 MG PO TABS: 145.0000 mg | ORAL_TABLET | Freq: Every day | ORAL | 1 refills | Status: DC

## 2023-12-12 MED ORDER — LISINOPRIL 5 MG PO TABS: 5.0000 mg | ORAL_TABLET | Freq: Every day | ORAL | 1 refills | Status: DC

## 2023-12-12 NOTE — Assessment & Plan Note (Signed)
 Chronic.  Controlled.  Continue with current medication regimen of Metformin  and ozempic .  Has lost 20lbs since January.  Continue Lisinopril  5mg  for kidney protection.  Microalbumin up to date.    Last A1c was 7.4%.  Continue Crestor  to help with Cardiac protection.  Side effects and benefits of medication discussed during visit.  Labs ordered today.  Return to clinic in 6 months for reevaluation.  Call sooner if concerns arise.

## 2023-12-12 NOTE — Assessment & Plan Note (Signed)
 Chronic.  Controlled.  Continue with current medication regimen of Fenofibrate .  Continue with Rosuvastatin  10mg  daily.  Refills sent today.  Labs ordered today.  Return to clinic in 6 months for reevaluation.  Call sooner if concerns arise.

## 2023-12-12 NOTE — Progress Notes (Signed)
 BP 116/73 (BP Location: Right Arm, Patient Position: Sitting, Cuff Size: Large)   Pulse 70   Temp 98.4 F (36.9 C) (Oral)   Resp 17   Ht 6' 8 (2.032 m)   Wt (!) 331 lb 12.8 oz (150.5 kg)   SpO2 98%   BMI 36.45 kg/m    Subjective:    Patient ID: Collin Butler, male    DOB: 09-18-1985, 38 y.o.   MRN: [id number]  HPI: Collin Butler is a 38 y.o. male presenting on 12/12/2023 for comprehensive medical examination. Current medical complaints include:none  He currently lives with: Interim Problems from his last visit: no  HYPERLIPIDEMIA Doing well with Rousvastatin and Fenofibrate . Hyperlipidemia status: excellent compliance Satisfied with current treatment?  yes Side effects:  no Medication compliance: excellent compliance Past cholesterol meds: fenofibrate  (tricor ) Supplements: none Aspirin:  no The ASCVD Risk score (Arnett DK, et al., 2019) failed to calculate for the following reasons:   The 2019 ASCVD risk score is only valid for ages 71 to 20 Chest pain:  no Coronary artery disease:  no Family history CAD:  no Family history early CAD:  no  DIABETES Last A1c was 7.4%. Metformin  500mg  BID and Ozempic  0.5mg  Has lost about 20lbs since starting Ozempic . Hypoglycemic episodes:no Polydipsia/polyuria: no Visual disturbance: no Chest pain: no Paresthesias: no Glucose Monitoring: no  Accucheck frequency: Not Checking  Fasting glucose:  Post prandial:  Evening:  Before meals: Taking Insulin?: no  Long acting insulin:  Short acting insulin: Blood Pressure Monitoring: not checking Retinal Examination: Not up to date Foot Exam: Up to Date Diabetic Education: Not Completed Pneumovax: Up to Date Influenza: Up to Date Aspirin: no  Depression Screen done today and results listed below:     12/12/2023    8:31 AM 04/06/2023    9:13 AM 10/06/2022    9:07 AM 04/06/2022    9:03 AM 10/17/2021    8:19 AM  Depression screen PHQ 2/9  Decreased Interest 0 0 0 0 0  Down,  Depressed, Hopeless 0 0 0 0 0  PHQ - 2 Score 0 0 0 0 0  Altered sleeping 1 0 0 0 0  Tired, decreased energy 0 0 0 0 0  Change in appetite 0 0 0 0 0  Feeling bad or failure about yourself  0 0 0 0 0  Trouble concentrating 0 0 0 0 0  Moving slowly or fidgety/restless 0 0 0 0 0  Suicidal thoughts 0 0 0 0 0  PHQ-9 Score 1 0 0 0 0  Difficult doing work/chores  Not difficult at all Not difficult at all Not difficult at all Not difficult at all    The patient does not have a history of falls. I did complete a risk assessment for falls. A plan of care for falls was documented.   Past Medical History:  Past Medical History:  Diagnosis Date   Asthma    Hypogonadism in male    Klinefelter syndrome    Peripheral vascular disease (HCC)    Sleep apnea     Surgical History:  Past Surgical History:  Procedure Laterality Date   NO PAST SURGERIES      Medications:  Current Outpatient Medications on File Prior to Visit  Medication Sig   levocetirizine (XYZAL) 5 MG tablet Take 5 mg by mouth every evening.   Multiple Vitamins-Minerals (MULTIVITAMIN MEN PO) Take by mouth.   NEEDLE, DISP, 18 G 18G X 1 MISC Use as directed  prednisoLONE acetate (PRED FORTE) 1 % ophthalmic suspension 1 drop 3 (three) times daily.   SYRINGE-NEEDLE, DISP, 3 ML (LUER LOCK SAFETY SYRINGES) 21G X 1-1/2 3 ML MISC Use as directed for testosterone  administration   testosterone  cypionate (DEPOTESTOSTERONE CYPIONATE) 200 MG/ML injection Inject 1 mL (200 mg total) into the muscle every 14 (fourteen) days.   No current facility-administered medications on file prior to visit.    Allergies:  Allergies  Allergen Reactions   Avocado Itching   Banana Itching    Social History:  Social History   Socioeconomic History   Marital status: Married    Spouse name: Mckinley Spells   Number of children: 0   Years of education: Not on file   Highest education level: Not on file  Occupational History   Not on file  Tobacco  Use   Smoking status: Never   Smokeless tobacco: Never  Vaping Use   Vaping status: Never Used  Substance and Sexual Activity   Alcohol use: Not Currently   Drug use: No   Sexual activity: Yes    Birth control/protection: None  Other Topics Concern   Not on file  Social History Narrative   Not on file   Social Drivers of Health   Financial Resource Strain: Low Risk  (12/12/2023)   Overall Financial Resource Strain (CARDIA)    Difficulty of Paying Living Expenses: Not hard at all  Food Insecurity: Not on file  Transportation Needs: Not on file  Physical Activity: Insufficiently Active (12/12/2023)   Exercise Vital Sign    Days of Exercise per Week: 2 days    Minutes of Exercise per Session: 40 min  Stress: No Stress Concern Present (12/12/2023)   [employer] of Occupational Health - Occupational Stress Questionnaire    Feeling of Stress : Not at all  Social Connections: Socially Isolated (12/12/2023)   Social Connection and Isolation Panel [NHANES]    Frequency of Communication with Friends and Family: Never    Frequency of Social Gatherings with Friends and Family: Twice a week    Attends Religious Services: Never    Database administrator or Organizations: No    Attends Banker Meetings: Never    Marital Status: Married  Catering manager Violence: Not At Risk (12/12/2023)   Humiliation, Afraid, Rape, and Kick questionnaire    Fear of Current or Ex-Partner: No    Emotionally Abused: No    Physically Abused: No    Sexually Abused: No   Social History   Tobacco Use  Smoking Status Never  Smokeless Tobacco Never   Social History   Substance and Sexual Activity  Alcohol Use Not Currently    Family History:  Family History  Problem Relation Age of Onset   Diabetes type I Sister    Cancer Maternal Grandmother    Hypertension Maternal Grandfather    Prostate cancer Neg Hx    Bladder Cancer Neg Hx    Kidney cancer Neg Hx     Past medical  history, surgical history, medications, allergies, family history and social history reviewed with patient today and changes made to appropriate areas of the chart.   Review of Systems  HENT:         Denies vision changes.  Eyes:  Negative for blurred vision and double vision.  Respiratory:  Negative for shortness of breath.   Cardiovascular:  Negative for chest pain, palpitations and leg swelling.  Neurological:  Negative for dizziness, tingling and headaches.  Endo/Heme/Allergies:  Negative for polydipsia.       Denies Polyuria   All other ROS negative except what is listed above and in the HPI.      Objective:    BP 116/73 (BP Location: Right Arm, Patient Position: Sitting, Cuff Size: Large)   Pulse 70   Temp 98.4 F (36.9 C) (Oral)   Resp 17   Ht 6' 8 (2.032 m)   Wt (!) 331 lb 12.8 oz (150.5 kg)   SpO2 98%   BMI 36.45 kg/m   Wt Readings from Last 3 Encounters:  12/12/23 (!) 331 lb 12.8 oz (150.5 kg)  07/27/23 (!) 350 lb (158.8 kg)  06/28/23 (!) 330 lb (149.7 kg)    Physical Exam Vitals and nursing note reviewed.  Constitutional:      General: He is not in acute distress.    Appearance: Normal appearance. He is obese. He is not ill-appearing, toxic-appearing or diaphoretic.  HENT:     Head: Normocephalic.     Right Ear: Tympanic membrane, ear canal and external ear normal.     Left Ear: Tympanic membrane, ear canal and external ear normal.     Nose: Nose normal. No congestion or rhinorrhea.     Mouth/Throat:     Mouth: Mucous membranes are moist.  Eyes:     General:        Right eye: No discharge.        Left eye: No discharge.     Extraocular Movements: Extraocular movements intact.     Conjunctiva/sclera: Conjunctivae normal.     Pupils: Pupils are equal, round, and reactive to light.  Cardiovascular:     Rate and Rhythm: Normal rate and regular rhythm.     Heart sounds: No murmur heard. Pulmonary:     Effort: Pulmonary effort is normal. No respiratory  distress.     Breath sounds: Normal breath sounds. No wheezing, rhonchi or rales.  Abdominal:     General: Abdomen is flat. Bowel sounds are normal. There is no distension.     Palpations: Abdomen is soft.     Tenderness: There is no abdominal tenderness. There is no guarding.  Musculoskeletal:     Cervical back: Normal range of motion and neck supple.  Skin:    General: Skin is warm and dry.     Capillary Refill: Capillary refill takes less than 2 seconds.  Neurological:     General: No focal deficit present.     Mental Status: He is alert and oriented to person, place, and time.     Cranial Nerves: No cranial nerve deficit.     Motor: No weakness.     Deep Tendon Reflexes: Reflexes normal.  Psychiatric:        Mood and Affect: Mood normal.        Behavior: Behavior normal.        Thought Content: Thought content normal.        Judgment: Judgment normal.     Results for orders placed or performed in visit on 09/24/23  PSA   Collection Time: 09/24/23 10:12 AM  Result Value Ref Range   Prostate Specific Ag, Serum 0.6 0.0 - 4.0 ng/mL  Testosterone    Collection Time: 09/24/23 10:12 AM  Result Value Ref Range   Testosterone  630 264 - 916 ng/dL  Hemoglobin and hematocrit, blood   Collection Time: 09/24/23 10:12 AM  Result Value Ref Range   Hemoglobin 12.8 (L) 13.0 - 17.7 g/dL   Hematocrit 47.8 29.5 -  51.0 %      Assessment & Plan:   Problem List Items Addressed This Visit       Cardiovascular and Mediastinum   Type 2 diabetes mellitus with circulatory disorder (HCC)   Chronic.  Controlled.  Continue with current medication regimen of Metformin  and ozempic .  Has lost 20lbs since January.  Continue Lisinopril  5mg  for kidney protection.  Microalbumin up to date.    Last A1c was 7.4%.  Continue Crestor  to help with Cardiac protection.  Side effects and benefits of medication discussed during visit.  Labs ordered today.  Return to clinic in 6 months for reevaluation.  Call  sooner if concerns arise.       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  Controlled.  Continue with current medication regimen of Fenofibrate .  Continue with Rosuvastatin  10mg  daily.  Refills sent today.  Labs ordered today.  Return to clinic in 6 months for reevaluation.  Call sooner if concerns arise.       Relevant Medications   fenofibrate  (TRICOR ) 145 MG tablet   lisinopril  (ZESTRIL ) 5 MG tablet   metFORMIN  (GLUCOPHAGE ) 500 MG tablet   rosuvastatin  (CRESTOR ) 10 MG tablet   Semaglutide ,0.25 or 0.5MG /DOS, (OZEMPIC , 0.25 OR 0.5 MG/DOSE,) 2 MG/3ML SOPN   Other Relevant Orders   Lipid panel   Hemoglobin A1c   Other Visit Diagnoses       Annual physical exam    -  Primary   Health maintenance reviewed during visit today.  Labs ordered.  Vaccines reviewed.   Relevant Orders   TSH   Lipid panel   CBC with Differential/Platelet   Comprehensive metabolic panel with GFR   Hemoglobin A1c        Discussed aspirin prophylaxis for myocardial infarction prevention and decision was it was not indicated  LABORATORY TESTING:  Health maintenance labs ordered today as discussed above.    IMMUNIZATIONS:   - Tdap: Tetanus vaccination status reviewed: last tetanus booster within 10 years. - Influenza: Administered today - Pneumovax: Administered today - Prevnar: Not applicable - COVID: Not applicable - HPV: Not applicable - Shingrix vaccine: Not applicable  SCREENING: - Colonoscopy: Not applicable  Discussed with patient purpose of the colonoscopy is to detect colon cancer at curable precancerous or early stages   - AAA Screening: Not applicable  -Hearing Test: Not applicable   -Spirometry: Not applicable   PATIENT COUNSELING:    Sexuality: Discussed sexually transmitted diseases, partner selection, use of condoms, avoidance of unintended pregnancy  and contraceptive alternatives.   Advised to avoid cigarette smoking.  I discussed with the patient that most people either abstain from alcohol or drink within safe limits (<=14/week and <=4 drinks/occasion for males, <=7/weeks and <= 3 drinks/occasion for females) and that the risk for alcohol disorders and other health effects rises proportionally with the number of drinks per week and how often a drinker exceeds daily limits.  Discussed cessation/primary prevention of drug use and availability of treatment for abuse.   Diet: Encouraged to adjust caloric intake to maintain  or achieve ideal body weight, to reduce intake of dietary saturated fat and total fat, to limit sodium intake by avoiding high sodium foods  and not adding table salt, and to maintain adequate dietary potassium and calcium  preferably from fresh fruits, vegetables, and low-fat dairy products.    stressed the importance of regular exercise  Injury prevention: Discussed safety belts, safety helmets, smoke detector, smoking near bedding or upholstery.   Dental health: Discussed importance of regular tooth brushing, flossing, and dental visits.   Follow up plan: NEXT PREVENTATIVE PHYSICAL DUE IN 1 YEAR. Return in about 6 months (around 06/12/2024) for HTN, HLD, DM2 FU.

## 2023-12-13 ENCOUNTER — Ambulatory Visit: Payer: Self-pay | Admitting: Nurse Practitioner

## 2023-12-13 LAB — LIPID PANEL
Chol/HDL Ratio: 2.5 ratio (ref 0.0–5.0)
Cholesterol, Total: 103 mg/dL (ref 100–199)
HDL: 42 mg/dL (ref >39)
LDL Chol Calc (NIH): 43 mg/dL (ref 0–99)
Triglycerides: 90 mg/dL (ref 0–149)
VLDL Cholesterol Cal: 18 mg/dL (ref 5–40)

## 2023-12-13 LAB — HEMOGLOBIN A1C
Est. average glucose Bld gHb Est-mCnc: 131 mg/dL
Hgb A1c MFr Bld: 6.2 % — ABNORMAL HIGH (ref 4.8–5.6)

## 2023-12-13 LAB — CBC WITH DIFFERENTIAL/PLATELET
Basophils Absolute: 0.1 10*3/uL (ref 0.0–0.2)
Basos: 1 %
EOS (ABSOLUTE): 0.3 10*3/uL (ref 0.0–0.4)
Eos: 3 %
Hematocrit: 43.6 % (ref 37.5–51.0)
Hemoglobin: 13.5 g/dL (ref 13.0–17.7)
Immature Grans (Abs): 0 10*3/uL (ref 0.0–0.1)
Immature Granulocytes: 0 %
Lymphocytes Absolute: 3 10*3/uL (ref 0.7–3.1)
Lymphs: 37 %
MCH: 25.6 pg — ABNORMAL LOW (ref 26.6–33.0)
MCHC: 31 g/dL — ABNORMAL LOW (ref 31.5–35.7)
MCV: 83 fL (ref 79–97)
Monocytes Absolute: 0.6 10*3/uL (ref 0.1–0.9)
Monocytes: 8 %
Neutrophils Absolute: 4 10*3/uL (ref 1.4–7.0)
Neutrophils: 51 %
Platelets: 232 10*3/uL (ref 150–450)
RBC: 5.28 x10E6/uL (ref 4.14–5.80)
RDW: 15 % (ref 11.6–15.4)
WBC: 7.9 10*3/uL (ref 3.4–10.8)

## 2023-12-13 LAB — COMPREHENSIVE METABOLIC PANEL WITH GFR
ALT: 66 IU/L — ABNORMAL HIGH (ref 0–44)
AST: 54 IU/L — ABNORMAL HIGH (ref 0–40)
Albumin: 4.5 g/dL (ref 4.1–5.1)
Alkaline Phosphatase: 64 IU/L (ref 44–121)
BUN/Creatinine Ratio: 12 (ref 9–20)
BUN: 13 mg/dL (ref 6–20)
Bilirubin Total: 0.3 mg/dL (ref 0.0–1.2)
CO2: 23 mmol/L (ref 20–29)
Calcium: 9.6 mg/dL (ref 8.7–10.2)
Chloride: 101 mmol/L (ref 96–106)
Creatinine, Ser: 1.12 mg/dL (ref 0.76–1.27)
Globulin, Total: 2.4 g/dL (ref 1.5–4.5)
Glucose: 111 mg/dL — ABNORMAL HIGH (ref 70–99)
Potassium: 4.3 mmol/L (ref 3.5–5.2)
Sodium: 139 mmol/L (ref 134–144)
Total Protein: 6.9 g/dL (ref 6.0–8.5)
eGFR: 87 mL/min/{1.73_m2} (ref >59)

## 2023-12-13 LAB — TSH: TSH: 5.3 u[IU]/mL — ABNORMAL HIGH (ref 0.450–4.500)

## 2023-12-27 ENCOUNTER — Other Ambulatory Visit: Payer: Self-pay

## 2023-12-27 ENCOUNTER — Other Ambulatory Visit: Payer: PRIVATE HEALTH INSURANCE

## 2023-12-27 DIAGNOSIS — E291 Testicular hypofunction: Secondary | ICD-10-CM

## 2023-12-28 LAB — HEMOGLOBIN AND HEMATOCRIT, BLOOD
Hematocrit: 38.8 % (ref 37.5–51.0)
Hemoglobin: 12 g/dL — ABNORMAL LOW (ref 13.0–17.7)

## 2023-12-28 LAB — TESTOSTERONE: Testosterone: 117 ng/dL — ABNORMAL LOW (ref 264–916)

## 2023-12-29 ENCOUNTER — Ambulatory Visit: Payer: Self-pay | Admitting: Urology

## 2024-01-25 ENCOUNTER — Other Ambulatory Visit (HOSPITAL_COMMUNITY): Payer: Self-pay

## 2024-01-25 ENCOUNTER — Telehealth: Payer: Self-pay | Admitting: Pharmacy Technician

## 2024-01-25 NOTE — Telephone Encounter (Signed)
 Pharmacy Patient Advocate Encounter   Received notification from [employer] that prior authorization for Ozempic  (0.25mg  or 0.5mg /dose) 2mg /4ml auto-injectors is required/requested.   Insurance verification completed.   The patient is insured through [facility] .   Per test claim: Refill too soon. PA is not needed at this time. Medication was filled 01/22/2024. Next eligible fill date is 02/12/2024.

## 2024-02-19 ENCOUNTER — Other Ambulatory Visit: Payer: Self-pay | Admitting: Urology

## 2024-03-25 ENCOUNTER — Encounter: Payer: Self-pay | Admitting: Nurse Practitioner

## 2024-03-26 ENCOUNTER — Other Ambulatory Visit (HOSPITAL_COMMUNITY): Payer: Self-pay

## 2024-03-27 ENCOUNTER — Other Ambulatory Visit (HOSPITAL_COMMUNITY): Payer: Self-pay

## 2024-05-27 ENCOUNTER — Other Ambulatory Visit: Payer: Self-pay | Admitting: Urology

## 2024-05-27 ENCOUNTER — Other Ambulatory Visit: Payer: Self-pay | Admitting: Nurse Practitioner

## 2024-05-27 DIAGNOSIS — E1169 Type 2 diabetes mellitus with other specified complication: Secondary | ICD-10-CM

## 2024-05-28 NOTE — Telephone Encounter (Signed)
 Requested Prescriptions  Pending Prescriptions Disp Refills   Semaglutide ,0.25 or 0.5MG /DOS, (OZEMPIC , 0.25 OR 0.5 MG/DOSE,) 2 MG/3ML SOPN [Pharmacy Med Name: Ozempic  0.25 mg or 0.5 mg (2 mg/3 mL) subcutaneous pen injector (semaglutide )] 6 mL 0    Sig: Inject 0.5 mg under the skin once a week.     Endocrinology:  Diabetes - GLP-1 Receptor Agonists - semaglutide  Failed - 05/28/2024  9:47 AM      Failed - HBA1C in normal range and within 180 days    Hgb A1c MFr Bld  Date Value Ref Range Status  12/12/2023 6.2 (H) 4.8 - 5.6 % Final    Comment:             Prediabetes: 5.7 - 6.4          Diabetes: >6.4          Glycemic control for adults with diabetes: <7.0          Passed - Cr in normal range and within 360 days    Creatinine, Ser  Date Value Ref Range Status  12/12/2023 1.12 0.76 - 1.27 mg/dL Final         Passed - Valid encounter within last 6 months    Recent Outpatient Visits           5 months ago Annual physical exam   [facility] Melvin Pao, NP

## 2024-06-06 ENCOUNTER — Ambulatory Visit: Payer: PRIVATE HEALTH INSURANCE | Admitting: Nurse Practitioner

## 2024-06-06 ENCOUNTER — Encounter: Payer: Self-pay | Admitting: Nurse Practitioner

## 2024-06-06 VITALS — BP 115/77 | HR 67 | Temp 97.5°F | Ht >= 80 in | Wt 332.0 lb

## 2024-06-06 DIAGNOSIS — E1159 Type 2 diabetes mellitus with other circulatory complications: Secondary | ICD-10-CM

## 2024-06-06 DIAGNOSIS — E1169 Type 2 diabetes mellitus with other specified complication: Secondary | ICD-10-CM

## 2024-06-06 LAB — MICROALBUMIN, URINE WAIVED
Creatinine, Urine Waived: 300 mg/dL (ref 10–300)
Microalb, Ur Waived: 80 mg/L — ABNORMAL HIGH (ref 0–19)

## 2024-06-06 MED ORDER — LISINOPRIL 5 MG PO TABS: 5.0000 mg | ORAL_TABLET | Freq: Every day | ORAL | 1 refills | Status: AC

## 2024-06-06 MED ORDER — OZEMPIC (0.25 OR 0.5 MG/DOSE) 2 MG/3ML ~~LOC~~ SOPN: 0.5000 mg | PEN_INJECTOR | SUBCUTANEOUS | 1 refills | Status: AC

## 2024-06-06 MED ORDER — FENOFIBRATE 145 MG PO TABS: 145.0000 mg | ORAL_TABLET | Freq: Every day | ORAL | 1 refills | Status: AC

## 2024-06-06 MED ORDER — METFORMIN HCL 500 MG PO TABS: ORAL_TABLET | ORAL | 1 refills | Status: AC

## 2024-06-06 MED ORDER — ROSUVASTATIN CALCIUM 10 MG PO TABS: 10.0000 mg | ORAL_TABLET | Freq: Every day | ORAL | 1 refills | Status: AC

## 2024-06-06 NOTE — Progress Notes (Signed)
 BP 115/77 (BP Location: Right Arm, Patient Position: Sitting, Cuff Size: Large)   Pulse 67   Temp (!) 97.5 F (36.4 C) (Oral)   Ht 6' 8 (2.032 m)   Wt (!) 332 lb (150.6 kg)   SpO2 95%   BMI 36.47 kg/m    Subjective:    Patient ID: Collin Butler, male    DOB: 1986/02/06, 38 y.o.   MRN: [id number]  HPI: [name] is a 38 y.o. male  Chief Complaint  Patient presents with   office visit    6 month F/u.   HYPERLIPIDEMIA Doing well with Rousvastatin and Fenofibrate . Hyperlipidemia status: excellent compliance Satisfied with current treatment?  yes Side effects:  no Medication compliance: excellent compliance Past cholesterol meds: fenofibrate  (tricor ) Supplements: none Aspirin:  no The ASCVD Risk score (Arnett DK, et al., 2019) failed to calculate for the following reasons:   The 2019 ASCVD risk score is only valid for ages 35 to 41 Chest pain:  no Coronary artery disease:  no Family history CAD:  no Family history early CAD:  no  DIABETES Last A1c was 7.4%. Metformin  500mg  BID and Ozempic  0.5mg  He would like to continue with his current dose of Ozempic .  Denies any side effects from the medication. Hypoglycemic episodes:no Polydipsia/polyuria: no Visual disturbance: no Chest pain: no Paresthesias: no Glucose Monitoring: no  Accucheck frequency: Not Checking  Fasting glucose:  Post prandial:  Evening:  Before meals: Taking Insulin?: no  Long acting insulin:  Short acting insulin: Blood Pressure Monitoring: not checking Retinal Examination: Not up to date Foot Exam: Up to Date Diabetic Education: Not Completed Pneumovax: Up to Date Influenza: Up to Date Aspirin: no   Relevant past medical, surgical, family and social history reviewed and updated as indicated. Interim medical history since our last visit reviewed. Allergies and medications reviewed and updated.  Review of Systems  Eyes:  Negative for visual disturbance.  Respiratory:  Negative for  chest tightness and shortness of breath.   Cardiovascular:  Negative for chest pain, palpitations and leg swelling.  Endocrine: Negative for polydipsia and polyuria.  Neurological:  Negative for dizziness, light-headedness, numbness and headaches.    Per HPI unless specifically indicated above     Objective:    BP 115/77 (BP Location: Right Arm, Patient Position: Sitting, Cuff Size: Large)   Pulse 67   Temp (!) 97.5 F (36.4 C) (Oral)   Ht 6' 8 (2.032 m)   Wt (!) 332 lb (150.6 kg)   SpO2 95%   BMI 36.47 kg/m   Wt Readings from Last 3 Encounters:  06/06/24 (!) 332 lb (150.6 kg)  12/12/23 (!) 331 lb 12.8 oz (150.5 kg)  07/27/23 (!) 350 lb (158.8 kg)    Physical Exam Vitals and nursing note reviewed.  Constitutional:      General: He is not in acute distress.    Appearance: Normal appearance. He is not ill-appearing, toxic-appearing or diaphoretic.  HENT:     Head: Normocephalic.     Right Ear: External ear normal.     Left Ear: External ear normal.     Nose: Nose normal. No congestion or rhinorrhea.     Mouth/Throat:     Mouth: Mucous membranes are moist.  Eyes:     General:        Right eye: No discharge.        Left eye: No discharge.     Extraocular Movements: Extraocular movements intact.     Conjunctiva/sclera: Conjunctivae  normal.     Pupils: Pupils are equal, round, and reactive to light.  Cardiovascular:     Rate and Rhythm: Normal rate and regular rhythm.     Heart sounds: No murmur heard. Pulmonary:     Effort: Pulmonary effort is normal. No respiratory distress.     Breath sounds: Normal breath sounds. No wheezing, rhonchi or rales.  Abdominal:     General: Abdomen is flat. Bowel sounds are normal.  Musculoskeletal:     Cervical back: Normal range of motion and neck supple.  Skin:    General: Skin is warm and dry.     Capillary Refill: Capillary refill takes less than 2 seconds.  Neurological:     General: No focal deficit present.     Mental  Status: He is alert and oriented to person, place, and time.  Psychiatric:        Mood and Affect: Mood normal.        Behavior: Behavior normal.        Thought Content: Thought content normal.        Judgment: Judgment normal.     Results for orders placed or performed in visit on 12/27/23  Testosterone    Collection Time: 12/27/23  1:53 PM  Result Value Ref Range   Testosterone  117 (L) 264 - 916 ng/dL  Hemoglobin and hematocrit, blood   Collection Time: 12/27/23  1:53 PM  Result Value Ref Range   Hemoglobin 12.0 (L) 13.0 - 17.7 g/dL   Hematocrit 61.1 62.4 - 51.0 %      Assessment & Plan:   Problem List Items Addressed This Visit       Cardiovascular and Mediastinum   Type 2 diabetes mellitus with circulatory disorder (HCC)   Chronic.  Controlled.  Continue with current medication regimen of Metformin  and ozempic  0.5mg  weekly. Continue with current dose.  Continue Lisinopril  5mg  for kidney protection.  Microalbumin checked today.   Last A1c was 62%.  Continue Crestor  to help with Cardiac protection.  Side effects and benefits of medication discussed during visit.  Labs ordered today.  Return to clinic in 6 months for reevaluation.  Call sooner if concerns arise.       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  Controlled.  Continue with current medication regimen of Fenofibrate  and rosuvastatin .  Refills sent today.  Labs ordered today.  Return to clinic in 6 months for reevaluation.  Call sooner if concerns arise.       Relevant Medications   fenofibrate  (TRICOR ) 145 MG tablet   lisinopril  (ZESTRIL ) 5 MG tablet   metFORMIN  (GLUCOPHAGE ) 500 MG tablet   rosuvastatin  (CRESTOR )  10 MG tablet   Semaglutide ,0.25 or 0.5MG /DOS, (OZEMPIC , 0.25 OR 0.5 MG/DOSE,) 2 MG/3ML SOPN   Other Relevant Orders   Comprehensive metabolic panel with GFR   Hemoglobin A1c   Lipid panel   Microalbumin, Urine Waived     Follow up plan: Return in about 6 months (around 12/05/2024) for Physical and Fasting labs.

## 2024-06-06 NOTE — Assessment & Plan Note (Signed)
 Chronic.  Controlled.  Continue with current medication regimen of Metformin  and ozempic  0.5mg  weekly. Continue with current dose.  Continue Lisinopril  5mg  for kidney protection.  Microalbumin checked today.   Last A1c was 62%.  Continue Crestor  to help with Cardiac protection.  Side effects and benefits of medication discussed during visit.  Labs ordered today.  Return to clinic in 6 months for reevaluation.  Call sooner if concerns arise.

## 2024-06-06 NOTE — Assessment & Plan Note (Signed)
 Chronic.  Controlled.  Continue with current medication regimen of Fenofibrate  and rosuvastatin .  Refills sent today.  Labs ordered today.  Return to clinic in 6 months for reevaluation.  Call sooner if concerns arise.

## 2024-06-07 LAB — COMPREHENSIVE METABOLIC PANEL WITH GFR
ALT: 103 IU/L — ABNORMAL HIGH (ref 0–44)
AST: 76 IU/L — ABNORMAL HIGH (ref 0–40)
Albumin: 4.5 g/dL (ref 4.1–5.1)
Alkaline Phosphatase: 68 IU/L (ref 47–123)
BUN/Creatinine Ratio: 9 (ref 9–20)
BUN: 10 mg/dL (ref 6–20)
Bilirubin Total: 0.3 mg/dL (ref 0.0–1.2)
CO2: 24 mmol/L (ref 20–29)
Calcium: 10 mg/dL (ref 8.7–10.2)
Chloride: 101 mmol/L (ref 96–106)
Creatinine, Ser: 1.06 mg/dL (ref 0.76–1.27)
Globulin, Total: 2.8 g/dL (ref 1.5–4.5)
Glucose: 138 mg/dL — ABNORMAL HIGH (ref 70–99)
Potassium: 4.3 mmol/L (ref 3.5–5.2)
Sodium: 139 mmol/L (ref 134–144)
Total Protein: 7.3 g/dL (ref 6.0–8.5)
eGFR: 92 mL/min/1.73 (ref >59)

## 2024-06-07 LAB — LIPID PANEL
Chol/HDL Ratio: 2.8 ratio (ref 0.0–5.0)
Cholesterol, Total: 101 mg/dL (ref 100–199)
HDL: 36 mg/dL — ABNORMAL LOW (ref >39)
LDL Chol Calc (NIH): 41 mg/dL (ref 0–99)
Triglycerides: 139 mg/dL (ref 0–149)
VLDL Cholesterol Cal: 24 mg/dL (ref 5–40)

## 2024-06-07 LAB — HEMOGLOBIN A1C
Est. average glucose Bld gHb Est-mCnc: 131 mg/dL
Hgb A1c MFr Bld: 6.2 % — ABNORMAL HIGH (ref 4.8–5.6)

## 2024-06-09 ENCOUNTER — Ambulatory Visit: Payer: Self-pay | Admitting: Nurse Practitioner

## 2024-06-12 ENCOUNTER — Ambulatory Visit: Payer: PRIVATE HEALTH INSURANCE | Admitting: Nurse Practitioner

## 2024-06-30 ENCOUNTER — Other Ambulatory Visit: Payer: Self-pay | Admitting: *Deleted

## 2024-06-30 DIAGNOSIS — E291 Testicular hypofunction: Secondary | ICD-10-CM

## 2024-07-01 ENCOUNTER — Other Ambulatory Visit: Payer: PRIVATE HEALTH INSURANCE

## 2024-07-01 DIAGNOSIS — E291 Testicular hypofunction: Secondary | ICD-10-CM

## 2024-07-02 ENCOUNTER — Ambulatory Visit: Payer: Self-pay | Admitting: Urology

## 2024-07-02 ENCOUNTER — Other Ambulatory Visit (HOSPITAL_COMMUNITY): Payer: Self-pay

## 2024-07-02 LAB — HEMOGLOBIN AND HEMATOCRIT, BLOOD
Hematocrit: 43.4 % (ref 37.5–51.0)
Hemoglobin: 13.8 g/dL (ref 13.0–17.7)

## 2024-07-02 LAB — TESTOSTERONE: Testosterone: 429 ng/dL (ref 264–916)

## 2024-07-09 ENCOUNTER — Telehealth: Payer: Self-pay | Admitting: Pharmacy Technician

## 2024-07-09 ENCOUNTER — Other Ambulatory Visit (HOSPITAL_COMMUNITY): Payer: Self-pay

## 2024-07-09 NOTE — Telephone Encounter (Signed)
 Pharmacy Patient Advocate Encounter   Received notification from [facility] KEY that prior authorization for Ozempic  (0.25 or 0.5 MG/DOSE) 2MG /3ML pen-injectors  is due for renewal.   Insurance verification completed.   The patient is insured through [facility] [employer].  Action: Medication is now available without a prior authorization.    CMM Key# [id number]

## 2024-07-15 NOTE — Progress Notes (Unsigned)
 "    07/16/2024 3:09 PM   Collin Butler 09/14/1985 [id number]  Referring provider: Melvin Pao, NP [address] [place],  [place] [address]  Urological history: 1.  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 man who presents today for yearly follow up.   Previous records reviewed.  He reports good adherence to testosterone  cypionate 200 mg/mL, 1 cc every 14 days.  Denies new complaints of low libido, erectile dysfunction, fatigue, or mood changes.  No complaints of gynecomastia, visual changes, or thromboembolic symptoms.  Energy level, libido and overall sense of wellbeing being reported as stable compared to last visit.    Testosterone  level is therapeutic.  Hemoglobin/hematocrit are normal.   Labs: 07/01/24 testosterone  429; Hemoglobin 13.8, Hematocrit 43.4   PMH: Past Medical History:  Diagnosis Date   Asthma    Hypogonadism in male    Klinefelter syndrome    Peripheral vascular disease    Sleep apnea     Surgical History: Past Surgical History:  Procedure Laterality Date   NO PAST SURGERIES      Home Medications:  Allergies as of 07/16/2024       Reactions   Avocado Itching   Banana Itching        Medication List        Accurate as of July 16, 2024  3:09 PM. If you have any questions, ask your nurse or doctor.          fenofibrate  145 MG tablet Commonly known as: TRICOR  Take 1 tablet (145 mg total) by mouth daily.   levocetirizine 5 MG tablet Commonly known as: XYZAL Take 5 mg by mouth every evening.   lisinopril  5 MG tablet Commonly known as: ZESTRIL  Take 1 tablet (5 mg total) by mouth daily.   Luer Lock Safety Syringes 21G X 1-1/2 3 ML Misc Generic drug: SYRINGE-NEEDLE (DISP) 3 ML Use as directed  for testosterone  administration   metFORMIN  500 MG tablet Commonly known as: GLUCOPHAGE  TAKE 1 TAB IN THE AM FOR 2 WEEKS AND THEN INCREASE TO 1 TAB TWICE A DAY   MULTIVITAMIN MEN PO Take by mouth.   NEEDLE (DISP) 18 G 18G X 1 Misc Use as directed   Ozempic  (0.25 or 0.5 MG/DOSE) 2 MG/3ML Sopn Generic drug: Semaglutide (0.25 or 0.5MG /DOS) Inject 0.5 mg into the skin once a week.   prednisoLONE acetate 1 % ophthalmic suspension Commonly known as: PRED FORTE 1 drop 3 (three) times daily.   rosuvastatin  10 MG tablet Commonly known as: CRESTOR  Take 1 tablet (10 mg total) by mouth daily.   testosterone  cypionate 200 MG/ML injection Commonly known as: DEPOTESTOSTERONE CYPIONATE Inject 1 mL (200 mg total) into the muscle every 14 (fourteen) days.        Allergies: Allergies[1]  Family History: Family History  Problem Relation Age of Onset   Diabetes type I Sister    Cancer Maternal Grandmother    Hypertension Maternal Grandfather    Prostate cancer Neg Hx    Bladder Cancer Neg Hx    Kidney cancer Neg Hx     Social History:  reports that he has never smoked. He has never used smokeless tobacco. He reports that he does not currently use alcohol. He reports that he does not use drugs.  ROS: Pertinent  ROS in HPI  Physical Exam: BP (!) 143/97   Pulse 83   Wt (!) 325 lb (147.4 kg)   SpO2 99%   BMI 35.70 kg/m   Constitutional:  Well nourished. Alert and oriented, No acute distress. HEENT: [place] AT, moist mucus membranes.   Cardiovascular: No clubbing, cyanosis, or edema. Respiratory: Normal respiratory effort, no increased work of breathing. GI: Abdomen is soft, non tender, non distended, no abdominal masses.  GU: No CVA tenderness.  No bladder fullness or masses. Skin: No rashes, bruises or suspicious lesions. Neurologic: Grossly intact, no focal deficits, moving all 4 extremities. Psychiatric: Normal mood and affect.  Laboratory Data: Testosterone            Component Ref Range & Units (hover) 2 wk ago (07/01/24) 6 mo ago (12/27/23) 9 mo ago (09/24/23) 11 mo ago (07/27/23) 1 yr ago (12/07/22) 2 yr ago (06/09/22) 2 yr ago (12/08/21)  Testosterone  429 117 Low  CM 630 CM 80 Low  CM 443 CM 618 CM 571 CM   Hemoglobin and Hematocrit Component Ref Range & Units (hover) 2 wk ago (07/01/24) 6 mo ago (12/27/23) 7 mo ago (12/12/23) 9 mo ago (09/24/23) 1 yr ago (12/07/22) 2 yr ago (06/09/22) 2 yr ago (04/06/22)  Hemoglobin 13.8 12.0 Low  13.5 12.8 Low    15.1  Hematocrit 43.4 38.8 43.6 41.2 43.3 46.9 47.5   I have reviewed the labs.   Pertinent Imaging: N/A  Assessment & Plan:   Collin Butler is a 39 y.o. man who presents today for yearly follow up. Doing well with testosterone  cypionate 200 mg/mL, 1 cc every 14 days.   1. 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 - Testosterone  levels are therapeutic: 429 - Hemoglobin/hematocrit normal: hemoglobin 13.8, hematocrit 43.4  - Continue Testosterone  cypionate 200 mg/mL, 1 cc every 14 days. - RTC in 1 year for follow up with labs prior to visit.   Collin MALVA Sara, PA-C  [facility] [address]  [address] [place], [place] [address] [phone]     [1]  Allergies Allergen Reactions   Avocado Itching   Banana Itching   "

## 2024-07-16 ENCOUNTER — Ambulatory Visit: Payer: PRIVATE HEALTH INSURANCE

## 2024-07-16 VITALS — BP 143/97 | HR 83 | Wt 325.0 lb

## 2024-07-16 DIAGNOSIS — E291 Testicular hypofunction: Secondary | ICD-10-CM

## 2024-07-16 MED ORDER — TESTOSTERONE CYPIONATE 200 MG/ML IM SOLN: 200.0000 mg | INTRAMUSCULAR | 0 refills | Status: AC

## 2024-12-12 ENCOUNTER — Encounter: Payer: PRIVATE HEALTH INSURANCE | Admitting: Nurse Practitioner

## 2025-07-09 ENCOUNTER — Other Ambulatory Visit

## 2025-07-16 ENCOUNTER — Ambulatory Visit: Admitting: Urology
